# Patient Record
Sex: Male | Born: 1980 | Race: White | Hispanic: No | Marital: Single | Smoking: Former smoker
Health system: Southern US, Community
[De-identification: ages and names within clinical notes are randomized; demographics above are authoritative.]

## PROBLEM LIST (undated history)

## (undated) DIAGNOSIS — F32A Depression, unspecified: Secondary | ICD-10-CM

## (undated) DIAGNOSIS — K8020 Calculus of gallbladder without cholecystitis without obstruction: Secondary | ICD-10-CM

---

## 2021-04-29 ENCOUNTER — Inpatient Hospital Stay (HOSPITAL_COMMUNITY)
Admission: EM | Admit: 2021-04-29 | Discharge: 2021-05-01 | DRG: 917 | Disposition: A | Discharge status: Home / Self Care | Payer: BC Managed Care – PPO | Attending: Internal Medicine | Admitting: Internal Medicine

## 2021-04-29 DIAGNOSIS — F32A Depression, unspecified: Secondary | ICD-10-CM | POA: Diagnosis present

## 2021-04-29 DIAGNOSIS — R739 Hyperglycemia, unspecified: Secondary | ICD-10-CM | POA: Diagnosis present

## 2021-04-29 DIAGNOSIS — F1020 Alcohol dependence, uncomplicated: Secondary | ICD-10-CM | POA: Diagnosis present

## 2021-04-29 DIAGNOSIS — F1110 Opioid abuse, uncomplicated: Secondary | ICD-10-CM | POA: Diagnosis present

## 2021-04-29 DIAGNOSIS — T50901A Poisoning by unspecified drugs, medicaments and biological substances, accidental (unintentional), initial encounter: Secondary | ICD-10-CM | POA: Diagnosis not present

## 2021-04-29 DIAGNOSIS — J9601 Acute respiratory failure with hypoxia: Secondary | ICD-10-CM | POA: Diagnosis not present

## 2021-04-29 DIAGNOSIS — Z79899 Other long term (current) drug therapy: Secondary | ICD-10-CM

## 2021-04-29 DIAGNOSIS — Z20822 Contact with and (suspected) exposure to covid-19: Secondary | ICD-10-CM | POA: Diagnosis present

## 2021-04-29 DIAGNOSIS — J690 Pneumonitis due to inhalation of food and vomit: Secondary | ICD-10-CM | POA: Diagnosis present

## 2021-04-29 DIAGNOSIS — R042 Hemoptysis: Secondary | ICD-10-CM | POA: Diagnosis present

## 2021-04-29 DIAGNOSIS — Z813 Family history of other psychoactive substance abuse and dependence: Secondary | ICD-10-CM

## 2021-04-29 DIAGNOSIS — T17908A Unspecified foreign body in respiratory tract, part unspecified causing other injury, initial encounter: Secondary | ICD-10-CM

## 2021-04-29 DIAGNOSIS — R7401 Elevation of levels of liver transaminase levels: Secondary | ICD-10-CM | POA: Diagnosis present

## 2021-04-29 DIAGNOSIS — G4700 Insomnia, unspecified: Secondary | ICD-10-CM | POA: Diagnosis present

## 2021-04-29 HISTORY — DX: Calculus of gallbladder without cholecystitis without obstruction: K80.20

## 2021-04-29 HISTORY — DX: Depression, unspecified: F32.A

## 2021-04-30 ENCOUNTER — Emergency Department (HOSPITAL_COMMUNITY): Payer: BC Managed Care – PPO

## 2021-04-30 ENCOUNTER — Encounter (HOSPITAL_COMMUNITY): Payer: Self-pay

## 2021-04-30 DIAGNOSIS — R739 Hyperglycemia, unspecified: Secondary | ICD-10-CM | POA: Diagnosis present

## 2021-04-30 DIAGNOSIS — T50901A Poisoning by unspecified drugs, medicaments and biological substances, accidental (unintentional), initial encounter: Secondary | ICD-10-CM | POA: Diagnosis present

## 2021-04-30 DIAGNOSIS — Z813 Family history of other psychoactive substance abuse and dependence: Secondary | ICD-10-CM | POA: Diagnosis not present

## 2021-04-30 DIAGNOSIS — F32A Depression, unspecified: Secondary | ICD-10-CM | POA: Diagnosis present

## 2021-04-30 DIAGNOSIS — J9601 Acute respiratory failure with hypoxia: Secondary | ICD-10-CM | POA: Diagnosis present

## 2021-04-30 DIAGNOSIS — F1020 Alcohol dependence, uncomplicated: Secondary | ICD-10-CM | POA: Diagnosis present

## 2021-04-30 DIAGNOSIS — T17908A Unspecified foreign body in respiratory tract, part unspecified causing other injury, initial encounter: Secondary | ICD-10-CM | POA: Diagnosis not present

## 2021-04-30 DIAGNOSIS — F1110 Opioid abuse, uncomplicated: Secondary | ICD-10-CM | POA: Diagnosis present

## 2021-04-30 DIAGNOSIS — R042 Hemoptysis: Secondary | ICD-10-CM | POA: Diagnosis present

## 2021-04-30 DIAGNOSIS — R7401 Elevation of levels of liver transaminase levels: Secondary | ICD-10-CM | POA: Diagnosis present

## 2021-04-30 DIAGNOSIS — G4700 Insomnia, unspecified: Secondary | ICD-10-CM | POA: Diagnosis present

## 2021-04-30 DIAGNOSIS — J690 Pneumonitis due to inhalation of food and vomit: Secondary | ICD-10-CM | POA: Diagnosis present

## 2021-04-30 DIAGNOSIS — Z20822 Contact with and (suspected) exposure to covid-19: Secondary | ICD-10-CM | POA: Diagnosis present

## 2021-04-30 DIAGNOSIS — Z79899 Other long term (current) drug therapy: Secondary | ICD-10-CM | POA: Diagnosis not present

## 2021-04-30 LAB — ETHANOL: Alcohol, Ethyl (B): 38 mg/dL — ABNORMAL HIGH (ref <10)

## 2021-04-30 LAB — CBC WITH DIFFERENTIAL/PLATELET
Abs Immature Granulocytes: 0.1 10*3/uL — ABNORMAL HIGH (ref 0.00–0.07)
Basophils Absolute: 0.1 10*3/uL (ref 0.0–0.1)
Basophils Relative: 1 %
Eosinophils Absolute: 0.1 10*3/uL (ref 0.0–0.5)
Eosinophils Relative: 1 %
HCT: 47.2 % (ref 39.0–52.0)
Hemoglobin: 16.2 g/dL (ref 13.0–17.0)
Immature Granulocytes: 1 %
Lymphocytes Relative: 19 %
Lymphs Abs: 2 10*3/uL (ref 0.7–4.0)
MCH: 31.1 pg (ref 26.0–34.0)
MCHC: 34.3 g/dL (ref 30.0–36.0)
MCV: 90.6 fL (ref 80.0–100.0)
Monocytes Absolute: 0.6 10*3/uL (ref 0.1–1.0)
Monocytes Relative: 6 %
Neutro Abs: 7.7 10*3/uL (ref 1.7–7.7)
Neutrophils Relative %: 72 %
Platelets: 261 10*3/uL (ref 150–400)
RBC: 5.21 MIL/uL (ref 4.22–5.81)
RDW: 14.4 % (ref 11.5–15.5)
WBC: 10.5 10*3/uL (ref 4.0–10.5)
nRBC: 0 % (ref 0.0–0.2)

## 2021-04-30 LAB — COMPREHENSIVE METABOLIC PANEL
ALT: 31 U/L (ref 0–44)
AST: 46 U/L — ABNORMAL HIGH (ref 15–41)
Albumin: 4.1 g/dL (ref 3.5–5.0)
Alkaline Phosphatase: 53 U/L (ref 38–126)
Anion gap: 12 (ref 5–15)
BUN: 9 mg/dL (ref 6–20)
CO2: 23 mmol/L (ref 22–32)
Calcium: 8.7 mg/dL — ABNORMAL LOW (ref 8.9–10.3)
Chloride: 103 mmol/L (ref 98–111)
Creatinine, Ser: 0.94 mg/dL (ref 0.61–1.24)
GFR, Estimated: >60 mL/min (ref >60)
Glucose, Bld: 133 mg/dL — ABNORMAL HIGH (ref 70–99)
Potassium: 3.5 mmol/L (ref 3.5–5.1)
Sodium: 138 mmol/L (ref 135–145)
Total Bilirubin: 0.7 mg/dL (ref 0.3–1.2)
Total Protein: 7.2 g/dL (ref 6.5–8.1)

## 2021-04-30 LAB — URINALYSIS, ROUTINE W REFLEX MICROSCOPIC
Bilirubin Urine: NEGATIVE
Glucose, UA: NEGATIVE mg/dL
Hgb urine dipstick: NEGATIVE
Ketones, ur: NEGATIVE mg/dL
Leukocytes,Ua: NEGATIVE
Nitrite: NEGATIVE
Protein, ur: NEGATIVE mg/dL
Specific Gravity, Urine: 1.017 (ref 1.005–1.030)
pH: 6 (ref 5.0–8.0)

## 2021-04-30 LAB — SALICYLATE LEVEL: Salicylate Lvl: <7 mg/dL — ABNORMAL LOW (ref 7.0–30.0)

## 2021-04-30 LAB — MAGNESIUM: Magnesium: 1.9 mg/dL (ref 1.7–2.4)

## 2021-04-30 LAB — RESP PANEL BY RT-PCR (FLU A&B, COVID) ARPGX2
Influenza A by PCR: NEGATIVE
Influenza B by PCR: NEGATIVE
SARS Coronavirus 2 by RT PCR: NEGATIVE

## 2021-04-30 LAB — ACETAMINOPHEN LEVEL: Acetaminophen (Tylenol), Serum: <10 ug/mL — ABNORMAL LOW (ref 10–30)

## 2021-04-30 LAB — CBG MONITORING, ED: Glucose-Capillary: 134 mg/dL — ABNORMAL HIGH (ref 70–99)

## 2021-04-30 LAB — PHOSPHORUS: Phosphorus: 4.2 mg/dL (ref 2.5–4.6)

## 2021-04-30 MED ORDER — LORAZEPAM 2 MG/ML IJ SOLN: 0.0000 mg | Freq: Three times a day (TID) | INTRAMUSCULAR | Status: DC

## 2021-04-30 MED ORDER — SODIUM CHLORIDE 0.9 % IV SOLN
3.0000 g | Freq: Four times a day (QID) | INTRAVENOUS | Status: DC
  Administered 2021-04-30 – 2021-05-01 (×5): 3 g via INTRAVENOUS
  Filled 2021-04-30 (×6): qty 8

## 2021-04-30 MED ORDER — BUPROPION HCL ER (XL) 300 MG PO TB24
300.0000 mg | ORAL_TABLET | Freq: Every morning | ORAL | Status: DC
  Administered 2021-04-30 – 2021-05-01 (×2): 300 mg via ORAL
  Filled 2021-04-30 (×2): qty 2

## 2021-04-30 MED ORDER — LACTATED RINGERS IV BOLUS
1000.0000 mL | Freq: Once | INTRAVENOUS | Status: AC
  Administered 2021-04-30: 1000 mL via INTRAVENOUS

## 2021-04-30 MED ORDER — FOLIC ACID 1 MG PO TABS
1.0000 mg | ORAL_TABLET | Freq: Every day | ORAL | Status: DC
  Administered 2021-04-30 – 2021-05-01 (×2): 1 mg via ORAL
  Filled 2021-04-30 (×2): qty 1

## 2021-04-30 MED ORDER — PANTOPRAZOLE SODIUM 40 MG PO TBEC
40.0000 mg | DELAYED_RELEASE_TABLET | Freq: Every day | ORAL | Status: DC
  Administered 2021-04-30 – 2021-05-01 (×2): 40 mg via ORAL
  Filled 2021-04-30 (×2): qty 1

## 2021-04-30 MED ORDER — THIAMINE HCL 100 MG PO TABS
100.0000 mg | ORAL_TABLET | Freq: Every day | ORAL | Status: DC
  Administered 2021-04-30 – 2021-05-01 (×2): 100 mg via ORAL
  Filled 2021-04-30 (×2): qty 1

## 2021-04-30 MED ORDER — ACETAMINOPHEN 325 MG PO TABS: 650.0000 mg | ORAL_TABLET | ORAL | Status: DC | PRN

## 2021-04-30 MED ORDER — LORAZEPAM 1 MG PO TABS
1.0000 mg | ORAL_TABLET | ORAL | Status: DC | PRN
  Administered 2021-04-30: 1 mg via ORAL
  Filled 2021-04-30 (×2): qty 1

## 2021-04-30 MED ORDER — ADULT MULTIVITAMIN W/MINERALS CH
1.0000 | ORAL_TABLET | Freq: Every day | ORAL | Status: DC
  Administered 2021-04-30 – 2021-05-01 (×2): 1 via ORAL
  Filled 2021-04-30 (×2): qty 1

## 2021-04-30 MED ORDER — METHYLPREDNISOLONE SODIUM SUCC 40 MG IJ SOLR
40.0000 mg | Freq: Once | INTRAMUSCULAR | Status: AC
  Administered 2021-04-30: 40 mg via INTRAVENOUS
  Filled 2021-04-30: qty 1

## 2021-04-30 MED ORDER — LORAZEPAM 2 MG/ML IJ SOLN
0.0000 mg | INTRAMUSCULAR | Status: DC
  Administered 2021-04-30 – 2021-05-01 (×2): 1 mg via INTRAVENOUS
  Filled 2021-04-30 (×2): qty 1

## 2021-04-30 MED ORDER — SODIUM CHLORIDE 0.9 % IV BOLUS
1000.0000 mL | Freq: Once | INTRAVENOUS | Status: AC
  Administered 2021-04-30: 1000 mL via INTRAVENOUS

## 2021-04-30 MED ORDER — ONDANSETRON HCL 4 MG/2ML IJ SOLN
4.0000 mg | Freq: Four times a day (QID) | INTRAMUSCULAR | Status: DC | PRN
  Administered 2021-04-30: 4 mg via INTRAVENOUS
  Filled 2021-04-30: qty 2

## 2021-04-30 MED ORDER — POTASSIUM CHLORIDE CRYS ER 20 MEQ PO TBCR
40.0000 meq | EXTENDED_RELEASE_TABLET | Freq: Once | ORAL | Status: AC
  Administered 2021-04-30: 40 meq via ORAL
  Filled 2021-04-30: qty 2

## 2021-04-30 MED ORDER — MAGNESIUM SULFATE 2 GM/50ML IV SOLN
2.0000 g | Freq: Once | INTRAVENOUS | Status: AC
  Administered 2021-04-30: 2 g via INTRAVENOUS
  Filled 2021-04-30: qty 50

## 2021-04-30 MED ORDER — ACETAMINOPHEN 325 MG PO TABS: 650.0000 mg | ORAL_TABLET | Freq: Four times a day (QID) | ORAL | Status: DC | PRN

## 2021-04-30 MED ORDER — LORAZEPAM 2 MG/ML IJ SOLN: 1.0000 mg | INTRAMUSCULAR | Status: DC | PRN

## 2021-04-30 MED ORDER — SODIUM CHLORIDE 0.9 % IV SOLN
3.0000 g | Freq: Once | INTRAVENOUS | Status: AC
  Administered 2021-04-30: 3 g via INTRAVENOUS
  Filled 2021-04-30: qty 8

## 2021-04-30 MED ORDER — KETOROLAC TROMETHAMINE 30 MG/ML IJ SOLN
30.0000 mg | Freq: Once | INTRAMUSCULAR | Status: AC
  Administered 2021-04-30: 30 mg via INTRAVENOUS
  Filled 2021-04-30: qty 1

## 2021-04-30 MED ORDER — LACTATED RINGERS IV SOLN: INTRAVENOUS | Status: AC

## 2021-04-30 MED ORDER — ALBUTEROL SULFATE (2.5 MG/3ML) 0.083% IN NEBU: 2.5000 mg | INHALATION_SOLUTION | RESPIRATORY_TRACT | Status: DC | PRN

## 2021-04-30 MED ORDER — THIAMINE HCL 100 MG/ML IJ SOLN
100.0000 mg | Freq: Every day | INTRAMUSCULAR | Status: DC
  Filled 2021-04-30 (×2): qty 2

## 2021-04-30 MED ORDER — ESCITALOPRAM OXALATE 10 MG PO TABS
15.0000 mg | ORAL_TABLET | Freq: Every day | ORAL | Status: DC
  Administered 2021-04-30 – 2021-05-01 (×2): 15 mg via ORAL
  Filled 2021-04-30 (×2): qty 2

## 2021-04-30 NOTE — Consult Note (Signed)
[facility] Face-to-Face Psychiatry Consult   Reason for Consult: Depression.  Alcohol and opioid abuse.  Had an accidental overdose with street opioids.  Admitted for aspiration pneumonia Referring Physician: Dr. Robb Matar Patient Identification: Eric Nolan MRN:  [id number] Principal Diagnosis: Drug overdose, accidental or unintentional, initial encounter Diagnosis:  Principal Problem:   Drug overdose, accidental or unintentional, initial encounter Active Problems:   Hypocalcemia   Hyperglycemia   Elevated AST (SGOT)   Alcohol dependence (HCC)   Depression   Aspiration into airway   Total Time spent with patient: 30 minutes  Subjective:   Eric Nolan is a 40 y.o. male patient admitted with aspiration pneumonia, secondary to accidental overdose in which he was found apneic and cyanotic.  Patient adamantly denies this as an intentional overdose.  He denies any previous history of suicidal ideations, suicidal thoughts, and or self-harm behavior.  He denies any stressors, triggers, and on traumatic events that could have resulted in an intentional overdose he admits to daily use of alcohol and opiate.  He states he drinks about 5-10 alcoholic beverages a day, for the past year.  He states he also uses about 1/2 of OxyContin daily.  He does admit that his opiate are bought off the street, and it is unclear what he took this time as he has never felt this way before.  He reports that his memory is clouded, very groggy, and has difficulty recalling events taking place prior to this admission.  Patient is able to state and continuously refute any depression symptoms, anxiety, mania, intrusive thoughts, delusions, paranoia, and otherwise psychiatric symptoms for the past 2 weeks.  When providing substance abuse counseling, patient is open to substance abuse resources.  Patient is showing motivation to quit" getting closer", unfortunately he is not ready to do so at this time.  Patient continues to  deny suicidal ideations, homicidal ideations, and or auditory or visual hallucinations.  Patient does not appear to be a danger to himself and or others at this time, despite ongoing substance abuse.  Patient will be psychiatrically cleared and can follow up with outpatient psychiatric resources which will be provided in at after visit summary.  HPI:  Level 5 caveat acuity of condition.  Patient brought in by EMS after suspected narcotic overdose.  He snorted what he thought was "half of a 30 OxyContin".  He was apparently found to be apneic and cyanotic by his girlfriend.  EMS gave him 4 mg of intranasal Narcan.  On arrival he is awake and alert and very diaphoretic.  He complains of some shortness of breath.  EMS reports no vomiting or suspected aspiration or trauma.  Patient denies chest pain, abdominal pain, cough or fever. States he uses oxy on a regular basis and this was not a suicide attempt.  Denies any injection drug use. Denies any suicidal thoughts  Past Psychiatric History: Polysubstance use disorder.  No known outpatient psychiatric providers and or psychiatric services being received at this time.  No history of inpatient and or outpatient psychiatric admissions.  No history of suicide attempts.  No previous and/or current psychotropic medication has been prescribed.  Risk to Self: Denies Risk to Others: Denies  Prior Inpatient Therapy: Denies Prior Outpatient Therapy: Denies  Past Medical History:  Past Medical History:  Diagnosis Date   Depression    Gallstones    History reviewed. No pertinent surgical history. Family History:  Family History  Problem Relation Age of Onset   Valvular heart disease Father  Gout Father    Drug abuse Brother    Alzheimer's disease Maternal Grandmother    Heart attack Paternal Grandfather    Stroke Paternal Grandfather    Family Psychiatric  History: Denies Social History:  Social History   Substance and Sexual Activity  Alcohol Use  Yes   Alcohol/week: 70.0 standard drinks   Types: 70 Standard drinks or equivalent per week   Comment: 5-10 drinks per day     Social History   Substance and Sexual Activity  Drug Use Yes    Social History   Socioeconomic History   Marital status: Single    Spouse name: Not on file   Number of children: Not on file   Years of education: Not on file   Highest education level: Not on file  Occupational History   Not on file  Tobacco Use   Smoking status: Never   Smokeless tobacco: Never  Vaping Use   Vaping Use: Never used  Substance and Sexual Activity   Alcohol use: Yes    Alcohol/week: 70.0 standard drinks    Types: 70 Standard drinks or equivalent per week    Comment: 5-10 drinks per day   Drug use: Yes   Sexual activity: Yes  Other Topics Concern   Not on file  Social History Narrative   Not on file   Social Determinants of Health   Financial Resource Strain: Not on file  Food Insecurity: Not on file  Transportation Needs: Not on file  Physical Activity: Not on file  Stress: Not on file  Social Connections: Not on file   Additional Social History:    Allergies:  No Known Allergies  Labs:  Results for orders placed or performed during the hospital encounter of 04/29/21 (from the past 48 hour(s))  CBC with Differential/Platelet     Status: Abnormal   Collection Time: 04/30/21 12:21 AM  Result Value Ref Range   WBC 10.5 4.0 - 10.5 K/uL   RBC 5.21 4.22 - 5.81 MIL/uL   Hemoglobin 16.2 13.0 - 17.0 g/dL   HCT 96.0 45.4 - 09.8 %   MCV 90.6 80.0 - 100.0 fL   MCH 31.1 26.0 - 34.0 pg   MCHC 34.3 30.0 - 36.0 g/dL   RDW 11.9 14.7 - 82.9 %   Platelets 261 150 - 400 K/uL   nRBC 0.0 0.0 - 0.2 %   Neutrophils Relative % 72 %   Neutro Abs 7.7 1.7 - 7.7 K/uL   Lymphocytes Relative 19 %   Lymphs Abs 2.0 0.7 - 4.0 K/uL   Monocytes Relative 6 %   Monocytes Absolute 0.6 0.1 - 1.0 K/uL   Eosinophils Relative 1 %   Eosinophils Absolute 0.1 0.0 - 0.5 K/uL    Basophils Relative 1 %   Basophils Absolute 0.1 0.0 - 0.1 K/uL   Immature Granulocytes 1 %   Abs Immature Granulocytes 0.10 (H) 0.00 - 0.07 K/uL    Comment: Performed at [facility], [address], [place], [place] [address]  Comprehensive metabolic panel     Status: Abnormal   Collection Time: 04/30/21 12:21 AM  Result Value Ref Range   Sodium 138 135 - 145 mmol/L   Potassium 3.5 3.5 - 5.1 mmol/L   Chloride 103 98 - 111 mmol/L   CO2 23 22 - 32 mmol/L   Glucose, Bld 133 (H) 70 - 99 mg/dL    Comment: Glucose reference range applies only to samples taken after fasting for at  least 8 hours.   BUN 9 6 - 20 mg/dL   Creatinine, Ser 1.61 0.61 - 1.24 mg/dL   Calcium 8.7 (L) 8.9 - 10.3 mg/dL   Total Protein 7.2 6.5 - 8.1 g/dL   Albumin 4.1 3.5 - 5.0 g/dL   AST 46 (H) 15 - 41 U/L   ALT 31 0 - 44 U/L   Alkaline Phosphatase 53 38 - 126 U/L   Total Bilirubin 0.7 0.3 - 1.2 mg/dL   GFR, Estimated >09 >60 mL/min    Comment: (NOTE) Calculated using the CKD-EPI Creatinine Equation (2021)    Anion gap 12 5 - 15    Comment: Performed at [facility], [address], [place], [place] [address]  Ethanol     Status: Abnormal   Collection Time: 04/30/21 12:21 AM  Result Value Ref Range   Alcohol, Ethyl (B) 38 (H) <10 mg/dL    Comment: (NOTE) Lowest detectable limit for serum alcohol is 10 mg/dL.  For medical purposes only. Performed at [facility], [address] [place], [place] [address]   Acetaminophen level     Status: Abnormal   Collection Time: 04/30/21 12:21 AM  Result Value Ref Range   Acetaminophen (Tylenol), Serum <10 (L) 10 - 30 ug/mL    Comment: (NOTE) Therapeutic concentrations vary significantly. A range of 10-30 ug/mL  may be an effective concentration for many patients. However, some  are best treated at concentrations outside of this range. Acetaminophen concentrations >150 ug/mL at 4 hours after ingestion  and >50  ug/mL at 12 hours after ingestion are often associated with  toxic reactions.  Performed at [facility], [address] [place], [place] [address]   Salicylate level     Status: Abnormal   Collection Time: 04/30/21 12:21 AM  Result Value Ref Range   Salicylate Lvl <7.0 (L) 7.0 - 30.0 mg/dL    Comment: Performed at [facility], [address] [place], [place] [address]  Magnesium     Status: None   Collection Time: 04/30/21 12:21 AM  Result Value Ref Range   Magnesium 1.9 1.7 - 2.4 mg/dL    Comment: Performed at [facility], [address], [place], [place] [address]  Phosphorus     Status: None   Collection Time: 04/30/21 12:21 AM  Result Value Ref Range   Phosphorus 4.2 2.5 - 4.6 mg/dL    Comment: Performed at [facility], [address] [place], [place] [address]  CBG monitoring, ED     Status: Abnormal   Collection Time: 04/30/21 12:24 AM  Result Value Ref Range   Glucose-Capillary 134 (H) 70 - 99 mg/dL    Comment: Glucose reference range applies only to samples taken after fasting for at least 8 hours.  Resp Panel by RT-PCR (Flu A&B, Covid) Nasopharyngeal Swab     Status: None   Collection Time: 04/30/21  3:15 AM   Specimen: Nasopharyngeal Swab; Nasopharyngeal(NP) swabs in vial transport medium  Result Value Ref Range   SARS Coronavirus 2 by RT PCR NEGATIVE NEGATIVE    Comment: (NOTE) SARS-CoV-2 target nucleic acids are NOT DETECTED.  The SARS-CoV-2 RNA is generally detectable in upper respiratory specimens during the acute phase of infection. The lowest concentration of SARS-CoV-2 viral copies this assay can detect is 138 copies/mL. A negative result does not preclude SARS-Cov-2 infection and should not be used as the sole basis for treatment or other patient management  decisions. A negative result may occur with  improper specimen collection/handling, submission of specimen other than  nasopharyngeal swab, presence of viral mutation(s) within the areas targeted by this assay, and inadequate number of viral copies(<138 copies/mL). A negative result must be combined with clinical observations, patient history, and epidemiological information. The expected result is Negative.  Fact Sheet for Patients:  BloggerCourse.com  Fact Sheet for Healthcare Providers:  SeriousBroker.it  This test is no t yet approved or cleared by the [place] FDA and  has been authorized for detection and/or diagnosis of SARS-CoV-2 by FDA under an Emergency Use Authorization (EUA). This EUA will remain  in effect (meaning this test can be used) for the duration of the COVID-19 declaration under Section 564(b)(1) of the Act, 21 U.S.C.section 360bbb-3(b)(1), unless the authorization is terminated  or revoked sooner.       Influenza A by PCR NEGATIVE NEGATIVE   Influenza B by PCR NEGATIVE NEGATIVE    Comment: (NOTE) The Xpert Xpress SARS-CoV-2/FLU/RSV plus assay is intended as an aid in the diagnosis of influenza from Nasopharyngeal swab specimens and should not be used as a sole basis for treatment. Nasal washings and aspirates are unacceptable for Xpert Xpress SARS-CoV-2/FLU/RSV testing.  Fact Sheet for Patients: BloggerCourse.com  Fact Sheet for Healthcare Providers: SeriousBroker.it  This test is not yet approved or cleared by the [place] FDA and has been authorized for detection and/or diagnosis of SARS-CoV-2 by FDA under an Emergency Use Authorization (EUA). This EUA will remain in effect (meaning this test can be used) for the duration of the COVID-19 declaration under Section 564(b)(1) of the Act, 21 U.S.C. section 360bbb-3(b)(1), unless the authorization is terminated or revoked.  Performed at [facility], [address], [place], [place]  [address]   Urinalysis, Routine w reflex microscopic     Status: None   Collection Time: 04/30/21 10:18 AM  Result Value Ref Range   Color, Urine YELLOW YELLOW   APPearance CLEAR CLEAR   Specific Gravity, Urine 1.017 1.005 - 1.030   pH 6.0 5.0 - 8.0   Glucose, UA NEGATIVE NEGATIVE mg/dL   Hgb urine dipstick NEGATIVE NEGATIVE   Bilirubin Urine NEGATIVE NEGATIVE   Ketones, ur NEGATIVE NEGATIVE mg/dL   Protein, ur NEGATIVE NEGATIVE mg/dL   Nitrite NEGATIVE NEGATIVE   Leukocytes,Ua NEGATIVE NEGATIVE    Comment: Performed at [facility], [address] [place], [place] [address]    Current Facility-Administered Medications  Medication Dose Route Frequency Provider Last Rate Last Admin   acetaminophen (TYLENOL) tablet 650 mg  650 mg Oral Q6H PRN Bobette Mo, MD       albuterol (PROVENTIL) (2.5 MG/3ML) 0.083% nebulizer solution 2.5 mg  2.5 mg Nebulization Q4H PRN Shalhoub, Deno Lunger, MD       Ampicillin-Sulbactam (UNASYN) 3 g in sodium chloride 0.9 % 100 mL IVPB  3 g Intravenous Q6H Bobette Mo, MD 200 mL/hr at 04/30/21 1118 3 g at 04/30/21 1118   buPROPion (WELLBUTRIN XL) 24 hr tablet 300 mg  300 mg Oral q morning Bobette Mo, MD   300 mg at 04/30/21 3267   escitalopram (LEXAPRO) tablet 15 mg  15 mg Oral Daily Bobette Mo, MD   15 mg at 04/30/21 1245   folic acid (FOLVITE) tablet 1 mg  1 mg Oral Daily Bobette Mo, MD   1 mg at 04/30/21 8099   lactated ringers infusion   Intravenous Continuous Shalhoub, Deno Lunger,  MD 125 mL/hr at 04/30/21 0345 New Bag at 04/30/21 0345   LORazepam (ATIVAN) injection 0-4 mg  0-4 mg Intravenous Q4H Bobette Mo, MD   1 mg at 04/30/21 1003   Followed by   Melene Muller ON 05/02/2021] LORazepam (ATIVAN) injection 0-4 mg  0-4 mg Intravenous Q8H Bobette Mo, MD       LORazepam (ATIVAN) tablet 1-4 mg  1-4 mg Oral Q1H PRN Bobette Mo, MD       Or   LORazepam (ATIVAN) injection 1-4 mg  1-4 mg  Intravenous Q1H PRN Bobette Mo, MD       multivitamin with minerals tablet 1 tablet  1 tablet Oral Daily Bobette Mo, MD   1 tablet at 04/30/21 0938   ondansetron (ZOFRAN) injection 4 mg  4 mg Intravenous Q6H PRN Marinda Elk, MD   4 mg at 04/30/21 1108   pantoprazole (PROTONIX) EC tablet 40 mg  40 mg Oral Daily Bobette Mo, MD   40 mg at 04/30/21 1117   thiamine tablet 100 mg  100 mg Oral Daily Bobette Mo, MD   100 mg at 04/30/21 4098   Or   thiamine (B-1) injection 100 mg  100 mg Intravenous Daily Bobette Mo, MD       Current Outpatient Medications  Medication Sig Dispense Refill   buPROPion (WELLBUTRIN XL) 300 MG 24 hr tablet Take 300 mg by mouth every morning.     escitalopram (LEXAPRO) 10 MG tablet Take 15 mg by mouth daily.      Musculoskeletal: Strength & Muscle Tone: within normal limits Gait & Station: unable to stand Patient leans: N/A            Psychiatric Specialty Exam:  Presentation  General Appearance: Appropriate for Environment; Casual  Eye Contact:Fair  Speech:Clear and Coherent; Slow  Speech Volume:Decreased  Handedness:Right   Mood and Affect  Mood:-- (good)  Affect:Congruent; Appropriate   Thought Process  Thought Processes:Coherent; Linear  Descriptions of Associations:Intact  Orientation:Full (Time, Place and Person)  Thought Content:Logical  History of Schizophrenia/Schizoaffective disorder:No data recorded Duration of Psychotic Symptoms:No data recorded Hallucinations:Hallucinations: None  Ideas of Reference:None  Suicidal Thoughts:Suicidal Thoughts: No  Homicidal Thoughts:Homicidal Thoughts: No   Sensorium  Memory:Immediate Good; Remote Good; Recent Good  Judgment:Fair  Insight:Fair   Executive Functions  Concentration:Fair  Attention Span:Good  Recall:Good  Fund of Knowledge:Good  Language:Good   Psychomotor Activity  Psychomotor Activity:Psychomotor  Activity: Normal   Assets  Assets:Communication Skills; Desire for Improvement; Financial Resources/Insurance; Housing; Social Support; Physical Health; Leisure Time   Sleep  Sleep:Sleep: Fair   Physical Exam: Physical Exam Vitals and nursing note reviewed.  Constitutional:      General: He is sleeping.     Appearance: He is obese. He is ill-appearing and diaphoretic.  Neurological:     Mental Status: He is easily aroused.  Psychiatric:        Mood and Affect: Mood normal.        Behavior: Behavior normal.        Thought Content: Thought content normal.        Judgment: Judgment normal.   ROS Blood pressure (!) 133/92, pulse (!) 104, temperature 97.9 F (36.6 C), temperature source Oral, resp. rate 18, height 5\' 10"  (1.778 m), weight 81.6 kg, SpO2 95 %. Body mass index is 25.83 kg/m.  Treatment Plan Summary: Plan Psych cleared at this time. Will place resources in AVS. Continue to assess for  patients motivation to seek substance abuse resources.  -Continue CIWA protocol, no history of DTs at this time.   Psychiatry to sign off. Patient is not a danger to himself and or others at the this time, does not need IVC.   Disposition: No evidence of imminent risk to self or others at present.   Patient does not meet criteria for psychiatric inpatient admission. Supportive therapy provided about ongoing stressors. Refer to IOP. Discussed crisis plan, support from social network, calling 911, coming to the Emergency Department, and calling Suicide Hotline.  Maryagnes Amos, FNP 04/30/2021 11:43 AM

## 2021-04-30 NOTE — ED Notes (Signed)
Attempted to get urine sample from pt. Pt has been unable to pee in the last two hours.

## 2021-04-30 NOTE — ED Notes (Signed)
See triage note by this RN for arrival info.

## 2021-04-30 NOTE — H&P (Signed)
History and Physical    Eric Nolan [id number] DOB: 1980/08/03 DOA: 04/29/2021  PCP: Vonna Drafts, FNP  Patient coming from: Home.  I have personally briefly reviewed patient's old medical records in [place]  Chief Complaint: Overdose.  HPI: Eric Nolan is a 40 y.o. male with medical history significant of depression, gallstones, alcohol abuse who is coming to the emergency department due to being found cyanotic by his girlfriend who called EMS.  He vomited and aspirated to the airway.  The patient has been drinking 5-10 drinks every night, which can be like 4 or a beer, but he has also been using Percocet or oxycodone that he buys from the street.  He took what he describes only as a white pill.  However, he thinks that these would a lot stronger than what he normally uses because he does not remember much of what happened.  He denied suicidal ideations.  He does not have any homicidal ideations.  He feels depressed but not as bad as it could be.  He has been having insomnia.  He denied fever, chills, but complains of frontal headache and feels hung over.  No rhinorrhea, sore throat, wheezing, but has hemoptysis while in the emergency department.  He stated that this has made he is breathing better.  No chest pain, palpitations, diaphoresis, PND, orthopnea or pitting edema of the lower extremities.  He sometimes feels pressure, not pain in his RUQ.  Denied nausea, diarrhea, constipation, melena or hematochezia.  No dysuria, flank pain, frequency or hematuria.  No polyuria, polydipsia, polyphagia or blurred vision.  ED Course: Initial vital signs were temperature 97.9 F, pulse 121, respiration 20, BP 104/94 mmHg and O2 sat 96% on NRB mask.  He is currently satting in the mid to high 90s on nasal cannula 5 LPM.  He received 1000 mL of NS bolus and was started on Unasyn in the emergency department.  I ordered a 1000 mL of LR bolus, ketorolac 30 mg IVP, magnesium sulfate  2 g IVPB and K-Lor 40 mEq p.o. x1 dose.  Lab work: His CBC was normal with a white count of 10.5, hemoglobin 16.2 g deciliter platelets 261.  Acetaminophen and salicylate levels were unremarkable.  CMP showed a glucose of 133 and calcium of 8.7 mg/dL.  AST was 46 units/L.  The rest of the CMP values were within expected range.  Phosphorus was 4.2 and magnesium 1.9 mg/dL.  Coronavirus/influenza PCR was negative.  Imaging: A portable 1 view chest radiograph showed diffuse left interstitial and peribronchial densities that may be chronic or represent atypical infection.  Please see image and full radiology report for further details.  Review of Systems: As per HPI otherwise all other systems reviewed and are negative.  Past Medical History:  Diagnosis Date   Depression    Gallstones    History reviewed. No pertinent surgical history.  Social History  reports that he has never smoked. He has never used smokeless tobacco. He reports current alcohol use of about 70.0 standard drinks per week. He reports current drug use.  No Known Allergies  Family History  Problem Relation Age of Onset   Valvular heart disease Father    Gout Father    Drug abuse Brother    Alzheimer's disease Maternal Grandmother    Heart attack Paternal Grandfather    Stroke Paternal Grandfather    Prior to Admission medications   Medication Sig Start Date End Date Taking? Authorizing Provider  buPROPion (  WELLBUTRIN XL) 300 MG 24 hr tablet Take 300 mg by mouth every morning. 04/14/21  Yes [provider]  escitalopram (LEXAPRO) 10 MG tablet Take 15 mg by mouth daily. 04/14/21  Yes [provider]   Physical Exam: Vitals:   04/30/21 0315 04/30/21 0321 04/30/21 0400 04/30/21 0845  BP: (!) 136/93  122/81 121/80  Pulse: (!) 109 (!) 103 (!) 101 (!) 106  Resp: (!) 21 (!) 24 20 18   Temp:      TempSrc:      SpO2: (!) 87% 92% 91% 95%  Weight:      Height:       Constitutional: NAD, calm,  comfortable. Eyes: PERRL, lids and conjunctivae normal ENMT: Mucous membranes are moist. Posterior pharynx clear of any exudate or lesions. Neck: normal, supple, no masses, no thyromegaly Respiratory: Mild rhonchi bilaterally, no wheezing, no crackles. Normal respiratory effort. No accessory muscle use.  Cardiovascular: Tachycardic in the 100s and 110s, no murmurs / rubs / gallops. No extremity edema. 2+ pedal pulses. No carotid bruits.  Abdomen: No distention.  Soft, no tenderness, no masses palpated. No hepatosplenomegaly. Bowel sounds positive.  Musculoskeletal: no clubbing / cyanosis. Good ROM, no contractures. Normal muscle tone.  Skin: no rashes, lesions, ulcers metabolic recommendation. Neurologic: CN 2-12 grossly intact. Sensation intact, DTR normal. Strength 5/5 in all 4.  Psychiatric: Normal judgment and insight. Alert and oriented x 3. Normal mood.   Labs on Admission: I have personally reviewed following labs and imaging studies  CBC: Recent Labs  Lab 04/30/21 0021  WBC 10.5  NEUTROABS 7.7  HGB 16.2  HCT 47.2  MCV 90.6  PLT 261   Basic Metabolic Panel: Recent Labs  Lab 04/30/21 0021  NA 138  K 3.5  CL 103  CO2 23  GLUCOSE 133*  BUN 9  CREATININE 0.94  CALCIUM 8.7*  MG 1.9  PHOS 4.2   GFR: Estimated Creatinine Clearance: 110 mL/min (by C-G formula based on SCr of 0.94 mg/dL).  Liver Function Tests: Recent Labs  Lab 04/30/21 0021  AST 46*  ALT 31  ALKPHOS 53  BILITOT 0.7  PROT 7.2  ALBUMIN 4.1   Urine analysis: No results found for: COLORURINE, APPEARANCEUR, LABSPEC, PHURINE, GLUCOSEU, HGBUR, BILIRUBINUR, KETONESUR, PROTEINUR, UROBILINOGEN, NITRITE, LEUKOCYTESUR  Radiological Exams on Admission: DG Chest Portable 1 View  Result Date: 04/30/2021 CLINICAL DATA:  Shortness of breath. EXAM: PORTABLE CHEST 1 VIEW COMPARISON:  None. FINDINGS: Diffuse left interstitial and peribronchial densities which may be chronic or represent atypical infection.  Clinical correlation recommended. No focal consolidation, pleural effusion or pneumothorax. The cardiac silhouette is within limits. No acute osseous pathology. IMPRESSION: Diffuse left interstitial and peribronchial densities may be chronic or represent atypical infection. Electronically Signed   By: 13/01/2021 M.D.   On: 04/30/2021 00:33    EKG: Independently reviewed.   Assessment/Plan Principal Problem:   Drug overdose,  accidental or unintentional Awake and alert nat the moment. Continue admit to PCU labs inpatient. Narcan as needed. Consult behavioral health. Consult TOC team.  Active Problems:   Aspiration into airway Continue supplemental oxygen. Bronchodilators as needed. Continue Unasyn 3 g IVPB every 6 hours. Solu-Medrol 40 mg IVP x1.    Alcohol dependence (HCC) Begin lorazepam CIWA protocol. Folate, MVI and thiamine.    Elevated AST (SGOT) Alcohol cessation advised.    Depression Continue bupropion 300 mg p.o. daily. Continue escitalopram 15 mg p.o. daily. Consult behavioral health.    Hypocalcemia Recheck calcium level. Further work-up  depending on level.    Hyperglycemia Nonfasting level. Repeat back pain normally.    DVT prophylaxis: SCDs. Code Status:   Full code. Family Communication:   Disposition Plan:   Patient is from:  Home.  Anticipated DC to:  Home.  Anticipated DC date:  05/02/2021 or 05/03/2021.  Anticipated DC barriers: Clinical status. Consults called:  Behavioral health and TOC team. Admission status:  Inpatient/PCU.  Severity of Illness:  Reubin Milan MD [facility]  How to contact the [facility] Attending or Consulting provider [place] or covering provider during after hours [place], for this patient?   Check the care team in [facility] and look for a) attending/consulting [facility] provider listed and b) the [facility] team listed Log into [email or web address] and use [place]'s universal password to access. If you do not have the  password, please contact the hospital operator. Locate the [facility] provider you are looking for under [facility] and page to a number that you can be directly reached. If you still have difficulty reaching the provider, please page the [facility] (Director on Call) for the Hospitalists listed on amion for assistance.  04/30/2021, 9:40 AM   This document was prepared in Dragon voice recognition software and may contain some unintended transcription errors.

## 2021-04-30 NOTE — ED Triage Notes (Signed)
Pt BIBA for OD on some kind of narcotic. Per EMS, girlfriend of pt found him on floor cyanotic. EMS gave 4mg  Narcan IN with +change. Pt diaphoretic, a/ox4 with NRM on at arrival. Rhonchi heard in lung fields. Pt denies SI attempt

## 2021-04-30 NOTE — ED Provider Notes (Signed)
[facility] DEPT Provider Note   CSN: [id number] Arrival date & time: 04/29/21  2357     History Chief Complaint  Patient presents with   Drug Overdose    Torez Bratland is a 40 y.o. male.  Level 5 caveat acuity of condition.  Patient brought in by EMS after suspected narcotic overdose.  He snorted what he thought was "half of a 30 OxyContin".  He was apparently found to be apneic and cyanotic by his girlfriend.  EMS gave him 4 mg of intranasal Narcan.  On arrival he is awake and alert and very diaphoretic.  He complains of some shortness of breath.  EMS reports no vomiting or suspected aspiration or trauma.  Patient denies chest pain, abdominal pain, cough or fever. States he uses oxy on a regular basis and this was not a suicide attempt.  Denies any injection drug use. Denies any suicidal thoughts  The history is provided by the patient and the EMS personnel. The history is limited by the condition of the patient.  Drug Overdose Associated symptoms include shortness of breath. Pertinent negatives include no chest pain, no abdominal pain and no headaches.      Past Medical History:  Diagnosis Date   Depression     There are no problems to display for this patient.   History reviewed. No pertinent surgical history.     History reviewed. No pertinent family history.  Social History   Tobacco Use   Smoking status: Never   Smokeless tobacco: Never  Vaping Use   Vaping Use: Never used  Substance Use Topics   Alcohol use: Yes    Alcohol/week: 70.0 standard drinks    Types: 70 Standard drinks or equivalent per week    Comment: 5-10 drinks per day   Drug use: Yes    Home Medications Prior to Admission medications   Not on File    Allergies    Patient has no known allergies.  Review of Systems   Review of Systems  Constitutional:  Positive for diaphoresis. Negative for activity change, appetite change, fatigue and fever.   HENT:  Negative for congestion and rhinorrhea.   Respiratory:  Positive for cough and shortness of breath.   Cardiovascular:  Negative for chest pain.  Gastrointestinal:  Negative for abdominal pain, nausea and vomiting.  Genitourinary:  Negative for dysuria and hematuria.  Musculoskeletal:  Negative for arthralgias and myalgias.  Skin:  Negative for rash.  Neurological:  Negative for dizziness, weakness and headaches.  Psychiatric/Behavioral:  Negative for self-injury and suicidal ideas. The patient is not nervous/anxious.    all other systems are negative except as noted in the HPI and PMH.   Physical Exam Updated Vital Signs BP (!) 104/94 (BP Location: Right Arm)   Pulse (!) 121   Temp 97.9 F (36.6 C) (Oral)   Resp 20   Ht 5\' 10"  (1.778 m)   Wt 81.6 kg   SpO2 96%   BMI 25.83 kg/m   Physical Exam Vitals and nursing note reviewed.  Constitutional:      General: He is not in acute distress.    Appearance: He is well-developed. He is ill-appearing and diaphoretic.     Comments: Ill-appearing, diaphoretic, tachycardic, tachypneic.  HENT:     Head: Normocephalic and atraumatic.     Mouth/Throat:     Pharynx: No oropharyngeal exudate.  Eyes:     Conjunctiva/sclera: Conjunctivae normal.     Pupils: Pupils are equal, round, and  reactive to light.  Neck:     Comments: No meningismus. Cardiovascular:     Rate and Rhythm: Regular rhythm. Tachycardia present.     Heart sounds: Normal heart sounds. No murmur heard. Pulmonary:     Effort: Respiratory distress present.     Breath sounds: Rhonchi present.  Chest:     Chest wall: No tenderness.  Abdominal:     Palpations: Abdomen is soft.     Tenderness: There is no abdominal tenderness. There is no guarding or rebound.  Musculoskeletal:        General: No tenderness. Normal range of motion.     Cervical back: Normal range of motion and neck supple.  Skin:    General: Skin is warm.     Capillary Refill: Capillary refill  takes less than 2 seconds.  Neurological:     General: No focal deficit present.     Mental Status: He is alert and oriented to person, place, and time. Mental status is at baseline.     Cranial Nerves: No cranial nerve deficit.     Motor: No abnormal muscle tone.     Coordination: Coordination normal.     Comments:  5/5 strength throughout. CN 2-12 intact.Equal grip strength.   Psychiatric:        Behavior: Behavior normal.    ED Results / Procedures / Treatments   Labs (all labs ordered are listed, but only abnormal results are displayed) Labs Reviewed  CBC WITH DIFFERENTIAL/PLATELET - Abnormal; Notable for the following components:      Result Value   Abs Immature Granulocytes 0.10 (*)    All other components within normal limits  COMPREHENSIVE METABOLIC PANEL - Abnormal; Notable for the following components:   Glucose, Bld 133 (*)    Calcium 8.7 (*)    AST 46 (*)    All other components within normal limits  ETHANOL - Abnormal; Notable for the following components:   Alcohol, Ethyl (B) 38 (*)    All other components within normal limits  ACETAMINOPHEN LEVEL - Abnormal; Notable for the following components:   Acetaminophen (Tylenol), Serum <10 (*)    All other components within normal limits  SALICYLATE LEVEL - Abnormal; Notable for the following components:   Salicylate Lvl <7.0 (*)    All other components within normal limits  CBG MONITORING, ED - Abnormal; Notable for the following components:   Glucose-Capillary 134 (*)    All other components within normal limits  RESP PANEL BY RT-PCR (FLU A&B, COVID) [id number]  URINALYSIS, ROUTINE W REFLEX MICROSCOPIC    EKG None  Radiology DG Chest Portable 1 View  Result Date: 04/30/2021 CLINICAL DATA:  Shortness of breath. EXAM: PORTABLE CHEST 1 VIEW COMPARISON:  None. FINDINGS: Diffuse left interstitial and peribronchial densities which may be chronic or represent atypical infection. Clinical correlation recommended. No focal  consolidation, pleural effusion or pneumothorax. The cardiac silhouette is within limits. No acute osseous pathology. IMPRESSION: Diffuse left interstitial and peribronchial densities may be chronic or represent atypical infection. Electronically Signed   By: Elgie Collard M.D.   On: 04/30/2021 00:33    Procedures .Critical Care Performed by: Glynn Octave, MD Authorized by: Glynn Octave, MD   Critical care provider statement:    Critical care time (minutes):  45   Critical care time was exclusive of:  Separately billable procedures and treating other patients   Critical care was necessary to treat or prevent imminent or life-threatening deterioration of the following conditions:  Respiratory  failure and toxidrome   Critical care was time spent personally by me on the following activities:  Blood draw for specimens, development of treatment plan with patient or surrogate, evaluation of patient's response to treatment, examination of patient, ordering and performing treatments and interventions, ordering and review of laboratory studies, ordering and review of radiographic studies, pulse oximetry, re-evaluation of patient's condition, review of old charts and obtaining history from patient or surrogate   I assumed direction of critical care for this patient from another provider in my specialty: no     Care discussed with: admitting provider     Medications Ordered in ED Medications  sodium chloride 0.9 % bolus 1,000 mL (has no administration in time range)    ED Course  I have reviewed the triage vital signs and the nursing notes.  Pertinent labs & imaging results that were available during my care of the patient were reviewed by me and considered in my medical decision making (see chart for details).    MDM Rules/Calculators/A&P                          Suspected narcotic overdose status post Narcan.  He was apneic and cyanotic.  Now awake but tachycardic and diaphoretic and  hypoxic.  Diffuse rhonchi throughout.  Sinus tachycardia.  Patient tachycardic.  Does have new oxygen requirement.  X-ray is concerning for left airspace disease.  Suspect aspiration.  Labs otherwise reassuring.  He has not required any further Narcan since arrival in the ED.  He is awake But desaturates to the low 80s with exertion.  Lung sounds have improved.  No wheezing or rhonchi. Will start empiric Unasyn for suspected aspiration.  Remains tachypneic and tachycardic and hypoxic.  Will need medical admission.  Tachycardia is improving.  Still tachypneic and hypoxic requiring 4 to 5 L of oxygen. Admission discussed with Dr. Cyd Silence.   ED ECG REPORT   Date: 04/30/2021  Rate: 127  Rhythm: sinus tachycardia  QRS Axis: normal  Intervals: normal  ST/T Wave abnormalities: normal  Conduction Disutrbances:none  Narrative Interpretation:   Old EKG Reviewed: none available  I have personally reviewed the EKG tracing and agree with the computerized printout as noted.  Final Clinical Impression(s) / ED Diagnoses Final diagnoses:  Acute respiratory failure with hypoxia ([place])  Accidental overdose, initial encounter  Aspiration pneumonia of left lower lobe, unspecified aspiration pneumonia type [facility])    Rx / DC Orders ED Discharge Orders     None        Ezequiel Essex, MD 04/30/21 [phone]

## 2021-04-30 NOTE — Progress Notes (Signed)
A consult was received from an ED physician for Unasyn per pharmacy dosing.  The patient's profile has been reviewed for ht/wt/allergies/indication/available labs.    A one time order has been placed for Unasyn 3gm IV.  Further antibiotics/pharmacy consults should be ordered by admitting physician if indicated.                       Thank you, Maryellen Pile, PharmD 04/30/2021  6:17 AM

## 2021-04-30 NOTE — ED Notes (Signed)
NT attempted to ambulate PT on RA. Pt spo2 down to 81% RA after ambulating to bathroom. Pt back to bed, [place] 6L placed back on pt. MD aware.

## 2021-05-01 ENCOUNTER — Other Ambulatory Visit: Payer: Self-pay

## 2021-05-01 DIAGNOSIS — T17908A Unspecified foreign body in respiratory tract, part unspecified causing other injury, initial encounter: Secondary | ICD-10-CM

## 2021-05-01 DIAGNOSIS — J9601 Acute respiratory failure with hypoxia: Secondary | ICD-10-CM

## 2021-05-01 LAB — HIV ANTIBODY (ROUTINE TESTING W REFLEX): HIV Screen 4th Generation wRfx: NONREACTIVE

## 2021-05-01 MED ORDER — CHLORDIAZEPOXIDE HCL 5 MG PO CAPS: 25.0000 mg | ORAL_CAPSULE | Freq: Four times a day (QID) | ORAL | Status: DC | PRN

## 2021-05-01 MED ORDER — GUAIFENESIN ER 600 MG PO TB12: 600.0000 mg | ORAL_TABLET | Freq: Two times a day (BID) | ORAL | 2 refills | Status: DC

## 2021-05-01 MED ORDER — ALBUTEROL SULFATE HFA 108 (90 BASE) MCG/ACT IN AERS: 2.0000 | INHALATION_SPRAY | Freq: Four times a day (QID) | RESPIRATORY_TRACT | 2 refills | Status: DC | PRN

## 2021-05-01 MED ORDER — CHLORDIAZEPOXIDE HCL 25 MG PO CAPS: 25.0000 mg | ORAL_CAPSULE | Freq: Four times a day (QID) | ORAL | 0 refills | Status: DC | PRN

## 2021-05-01 MED ORDER — AMOXICILLIN-POT CLAVULANATE 875-125 MG PO TABS: 1.0000 | ORAL_TABLET | Freq: Two times a day (BID) | ORAL | 0 refills | Status: AC

## 2021-05-01 MED ORDER — HYDROXYZINE HCL 25 MG PO TABS: 25.0000 mg | ORAL_TABLET | Freq: Four times a day (QID) | ORAL | Status: DC | PRN

## 2021-05-01 MED ORDER — FOLIC ACID 1 MG PO TABS
1.0000 mg | ORAL_TABLET | Freq: Every day | ORAL | 1 refills | Status: DC
Start: 2021-05-02 — End: 2023-01-14

## 2021-05-01 MED ORDER — THIAMINE HCL 100 MG PO TABS: 100.0000 mg | ORAL_TABLET | Freq: Every day | ORAL | 1 refills | Status: DC

## 2021-05-01 NOTE — Progress Notes (Signed)
Patient was able to ambulate independently on RA in hallway. Gait steady, no tremors noted. O2 sats measured at 95% with ambulation. Pt stated he felt slightly short of breath with ambulation. MD updated on patients activity. Patients mother at bedside.

## 2021-05-01 NOTE — Plan of Care (Signed)
  Problem: Education: Goal: Knowledge of General Education information will improve Description Including pain rating scale, medication(s)/side effects and non-pharmacologic comfort measures Outcome: Progressing   Problem: Clinical Measurements: Goal: Ability to maintain clinical measurements within normal limits will improve Outcome: Progressing   Problem: Activity: Goal: Risk for activity intolerance will decrease Outcome: Progressing   

## 2021-05-02 NOTE — Discharge Summary (Addendum)
Physician Discharge Summary  Eric Nolan SAY:[id number] DOB: 1981-04-08 DOA: 04/29/2021  PCP: Diamantina Providence, FNP  Admit date: 04/29/2021 Discharge date: 05/01/2021  Admitted From: Home Disposition: Home  Recommendations for Outpatient Follow-up:  Follow up with PCP in 1-2 weeks Please obtain BMP/CBC in one week Outpatient referral to behavioral health  Home Health: Equipment/Devices:  Discharge Condition: Stable CODE STATUS: Full code Diet recommendation: Regular diet  Brief/Interim Summary: 40 year old male with a history of depression, alcohol abuse, was found to be cyanotic by his girlfriend who called EMS.  He had vomited and aspirated into his airway.  He was brought to the hospital where he was noted to be tachycardic.  He was initially placed on nonrebreather mask.  He received IV fluids and was started on Unasyn.  He reported that he purchases opiates on the street, and his current medications may have been stronger than what he normally gets.  He denies any suicidal or homicidal ideations.  He was admitted for further management  Discharge Diagnoses:  Principal Problem:   Drug overdose, accidental or unintentional, initial encounter Active Problems:   Hypocalcemia   Hyperglycemia   Elevated AST (SGOT)   Alcohol dependence (HCC)   Depression   Aspiration into airway  Drug overdose, unintentional -Patient was monitored in the hospital -Overall mental status has improved, back to baseline -No longer seems to be impaired -Seen by behavioral health team, not felt to be a candidate for inpatient psychiatry.  Recommended outpatient psychiatry follow-up -He was also seen by [facility] team for resources on substance abuse rehab  Aspiration into airway -Started on Unasyn -Transition to Augmentin to complete antibiotic course -Currently on room air  Alcohol dependence -Monitored on CIWA protocol -No signs of active withdrawal at this time -Give a short course of  Librium to be used at home  Depression -Continued on bupropion and Lexapro   Discharge Instructions  Discharge Instructions     Diet - low sodium heart healthy   Complete by: As directed    Increase activity slowly   Complete by: As directed       Allergies as of 05/01/2021   No Known Allergies      Medication List     TAKE these medications    albuterol 108 (90 Base) MCG/ACT inhaler Commonly known as: VENTOLIN HFA Inhale 2 puffs into the lungs every 6 (six) hours as needed for wheezing or shortness of breath.   amoxicillin-clavulanate 875-125 MG tablet Commonly known as: Augmentin Take 1 tablet by mouth 2 (two) times daily for 5 days.   buPROPion 300 MG 24 hr tablet Commonly known as: WELLBUTRIN XL Take 300 mg by mouth every morning.   chlordiazePOXIDE 25 MG capsule Commonly known as: LIBRIUM Take 1 capsule (25 mg total) by mouth every 6 (six) hours as needed for anxiety (tremors).   escitalopram 10 MG tablet Commonly known as: LEXAPRO Take 15 mg by mouth daily.   folic acid 1 MG tablet Commonly known as: FOLVITE Take 1 tablet (1 mg total) by mouth daily.   guaiFENesin 600 MG 12 hr tablet Commonly known as: Mucinex Take 1 tablet (600 mg total) by mouth 2 (two) times daily.   thiamine 100 MG tablet Take 1 tablet (100 mg total) by mouth daily.        Follow-up Information     [facility] Follow up.   Specialty: Behavioral Health Why: Chemical dependency instensive outpatient programming. Contact information: [address] [place] [address]  [phone]         [facility] .   Specialty: [facility] information: [address] [place] [address] [place] [address] [phone]        Diamantina Providence, FNP. Schedule an appointment as soon as possible for a visit in 2 week(s).   Specialty: Nurse Practitioner Contact information: [address] [place] [address] [phone]                No Known Allergies  Consultations: Behavioral health   Procedures/Studies: DG Chest Portable 1 View  Result Date: 04/30/2021 CLINICAL DATA:  Shortness of breath. EXAM: PORTABLE CHEST 1 VIEW COMPARISON:  None. FINDINGS: Diffuse left interstitial and peribronchial densities which may be chronic or represent atypical infection. Clinical correlation recommended. No focal consolidation, pleural effusion or pneumothorax. The cardiac silhouette is within limits. No acute osseous pathology. IMPRESSION: Diffuse left interstitial and peribronchial densities may be chronic or represent atypical infection. Electronically Signed   By: Elgie Collard M.D.   On: 04/30/2021 00:33      Subjective: Feeling better.  Shortness of breath improved.  Still has some mild productive cough  Discharge Exam: Vitals:   05/01/21 0917 05/01/21 0930 05/01/21 1035 05/01/21 1245  BP:   103/60 109/65  Pulse:  89 89 95  Resp:  18 20   Temp: 98.7 F (37.1 C)  98.1 F (36.7 C)   TempSrc: Oral  Oral   SpO2:  96% 93%   Weight:      Height:        General: Pt is alert, awake, not in acute distress Cardiovascular: RRR, S1/S2 +, no rubs, no gallops Respiratory: CTA bilaterally, no wheezing, no rhonchi Abdominal: Soft, NT, ND, bowel sounds + Extremities: no edema, no cyanosis    The results of significant diagnostics from this hospitalization (including imaging, microbiology, ancillary and laboratory) are listed below for reference.     Microbiology: Recent Results (from the past 240 hour(s))  Resp Panel by RT-PCR (Flu A&B, Covid) Nasopharyngeal Swab     Status: None   Collection Time: 04/30/21  3:15 AM   Specimen: Nasopharyngeal Swab; Nasopharyngeal(NP) swabs in vial transport medium  Result Value Ref Range Status   SARS Coronavirus 2 by RT PCR NEGATIVE NEGATIVE Final    Comment: (NOTE) SARS-CoV-2 target nucleic acids are NOT DETECTED.  The  SARS-CoV-2 RNA is generally detectable in upper respiratory specimens during the acute phase of infection. The lowest concentration of SARS-CoV-2 viral copies this assay can detect is 138 copies/mL. A negative result does not preclude SARS-Cov-2 infection and should not be used as the sole basis for treatment or other patient management decisions. A negative result may occur with  improper specimen collection/handling, submission of specimen other than nasopharyngeal swab, presence of viral mutation(s) within the areas targeted by this assay, and inadequate number of viral copies(<138 copies/mL). A negative result must be combined with clinical observations, patient history, and epidemiological information. The expected result is Negative.  Fact Sheet for Patients:  BloggerCourse.com  Fact Sheet for Healthcare Providers:  SeriousBroker.it  This test is no t yet approved or cleared by the [place] FDA and  has been authorized for detection and/or diagnosis of SARS-CoV-2 by FDA under an Emergency Use Authorization (EUA). This EUA will remain  in effect (meaning this test can be used) for the duration of the COVID-19 declaration under Section 564(b)(1) of the Act, 21 U.S.C.section 360bbb-3(b)(1), unless  the authorization is terminated  or revoked sooner.       Influenza A by PCR NEGATIVE NEGATIVE Final   Influenza B by PCR NEGATIVE NEGATIVE Final    Comment: (NOTE) The Xpert Xpress SARS-CoV-2/FLU/RSV plus assay is intended as an aid in the diagnosis of influenza from Nasopharyngeal swab specimens and should not be used as a sole basis for treatment. Nasal washings and aspirates are unacceptable for Xpert Xpress SARS-CoV-2/FLU/RSV testing.  Fact Sheet for Patients: BloggerCourse.com  Fact Sheet for Healthcare Providers: SeriousBroker.it  This test is not yet approved or  cleared by the [place] FDA and has been authorized for detection and/or diagnosis of SARS-CoV-2 by FDA under an Emergency Use Authorization (EUA). This EUA will remain in effect (meaning this test can be used) for the duration of the COVID-19 declaration under Section 564(b)(1) of the Act, 21 U.S.C. section 360bbb-3(b)(1), unless the authorization is terminated or revoked.  Performed at [facility], [address], [place], [place] [address]      Labs: BNP (last 3 results) No results for input(s): BNP in the last 8760 hours. Basic Metabolic Panel: Recent Labs  Lab 04/30/21 0021  NA 138  K 3.5  CL 103  CO2 23  GLUCOSE 133*  BUN 9  CREATININE 0.94  CALCIUM 8.7*  MG 1.9  PHOS 4.2   Liver Function Tests: Recent Labs  Lab 04/30/21 0021  AST 46*  ALT 31  ALKPHOS 53  BILITOT 0.7  PROT 7.2  ALBUMIN 4.1   No results for input(s): LIPASE, AMYLASE in the last 168 hours. No results for input(s): AMMONIA in the last 168 hours. CBC: Recent Labs  Lab 04/30/21 0021  WBC 10.5  NEUTROABS 7.7  HGB 16.2  HCT 47.2  MCV 90.6  PLT 261   Cardiac Enzymes: No results for input(s): CKTOTAL, CKMB, CKMBINDEX, TROPONINI in the last 168 hours. BNP: Invalid input(s): POCBNP CBG: Recent Labs  Lab 04/30/21 0024  GLUCAP 134*   D-Dimer No results for input(s): DDIMER in the last 72 hours. Hgb A1c No results for input(s): HGBA1C in the last 72 hours. Lipid Profile No results for input(s): CHOL, HDL, LDLCALC, TRIG, CHOLHDL, LDLDIRECT in the last 72 hours. Thyroid function studies No results for input(s): TSH, T4TOTAL, T3FREE, THYROIDAB in the last 72 hours.  Invalid input(s): FREET3 Anemia work up No results for input(s): VITAMINB12, FOLATE, FERRITIN, TIBC, IRON, RETICCTPCT in the last 72 hours. Urinalysis    Component Value Date/Time   COLORURINE YELLOW 04/30/2021 1018   APPEARANCEUR CLEAR 04/30/2021 1018   LABSPEC 1.017 04/30/2021 1018    PHURINE 6.0 04/30/2021 1018   GLUCOSEU NEGATIVE 04/30/2021 1018   HGBUR NEGATIVE 04/30/2021 1018   BILIRUBINUR NEGATIVE 04/30/2021 1018   KETONESUR NEGATIVE 04/30/2021 1018   PROTEINUR NEGATIVE 04/30/2021 1018   NITRITE NEGATIVE 04/30/2021 1018   LEUKOCYTESUR NEGATIVE 04/30/2021 1018   Sepsis Labs Invalid input(s): PROCALCITONIN,  WBC,  LACTICIDVEN Microbiology Recent Results (from the past 240 hour(s))  Resp Panel by RT-PCR (Flu A&B, Covid) Nasopharyngeal Swab     Status: None   Collection Time: 04/30/21  3:15 AM   Specimen: Nasopharyngeal Swab; Nasopharyngeal(NP) swabs in vial transport medium  Result Value Ref Range Status   SARS Coronavirus 2 by RT PCR NEGATIVE NEGATIVE Final    Comment: (NOTE) SARS-CoV-2 target nucleic acids are NOT DETECTED.  The SARS-CoV-2 RNA is generally detectable in upper respiratory specimens during the acute phase of infection. The lowest concentration of SARS-CoV-2 viral  copies this assay can detect is 138 copies/mL. A negative result does not preclude SARS-Cov-2 infection and should not be used as the sole basis for treatment or other patient management decisions. A negative result may occur with  improper specimen collection/handling, submission of specimen other than nasopharyngeal swab, presence of viral mutation(s) within the areas targeted by this assay, and inadequate number of viral copies(<138 copies/mL). A negative result must be combined with clinical observations, patient history, and epidemiological information. The expected result is Negative.  Fact Sheet for Patients:  BloggerCourse.com  Fact Sheet for Healthcare Providers:  SeriousBroker.it  This test is no t yet approved or cleared by the [place] FDA and  has been authorized for detection and/or diagnosis of SARS-CoV-2 by FDA under an Emergency Use Authorization (EUA). This EUA will remain  in effect (meaning this test can  be used) for the duration of the COVID-19 declaration under Section 564(b)(1) of the Act, 21 U.S.C.section 360bbb-3(b)(1), unless the authorization is terminated  or revoked sooner.       Influenza A by PCR NEGATIVE NEGATIVE Final   Influenza B by PCR NEGATIVE NEGATIVE Final    Comment: (NOTE) The Xpert Xpress SARS-CoV-2/FLU/RSV plus assay is intended as an aid in the diagnosis of influenza from Nasopharyngeal swab specimens and should not be used as a sole basis for treatment. Nasal washings and aspirates are unacceptable for Xpert Xpress SARS-CoV-2/FLU/RSV testing.  Fact Sheet for Patients: BloggerCourse.com  Fact Sheet for Healthcare Providers: SeriousBroker.it  This test is not yet approved or cleared by the [place] FDA and has been authorized for detection and/or diagnosis of SARS-CoV-2 by FDA under an Emergency Use Authorization (EUA). This EUA will remain in effect (meaning this test can be used) for the duration of the COVID-19 declaration under Section 564(b)(1) of the Act, 21 U.S.C. section 360bbb-3(b)(1), unless the authorization is terminated or revoked.  Performed at [facility], [address] [place], [place] [address]      Time coordinating discharge:  SIGNED:   Erick Blinks, MD  [facility] 05/02/2021, 8:55 PM   If 7PM-7AM, please contact night-coverage [email or web address]

## 2021-09-04 ENCOUNTER — Other Ambulatory Visit: Payer: Self-pay

## 2021-09-04 ENCOUNTER — Ambulatory Visit (INDEPENDENT_AMBULATORY_CARE_PROVIDER_SITE_OTHER): Payer: BC Managed Care – PPO

## 2021-09-04 ENCOUNTER — Ambulatory Visit: Payer: BC Managed Care – PPO | Admitting: Pulmonary Disease

## 2021-09-04 ENCOUNTER — Encounter: Payer: Self-pay | Admitting: Pulmonary Disease

## 2021-09-04 VITALS — BP 118/74 | HR 95 | Ht 70.0 in | Wt 200.0 lb

## 2021-09-04 DIAGNOSIS — J45998 Other asthma: Secondary | ICD-10-CM

## 2021-09-04 DIAGNOSIS — R0609 Other forms of dyspnea: Secondary | ICD-10-CM

## 2021-09-04 DIAGNOSIS — U099 Post covid-19 condition, unspecified: Secondary | ICD-10-CM | POA: Diagnosis not present

## 2021-09-04 MED ORDER — FLUTICASONE PROPIONATE HFA 110 MCG/ACT IN AERO: 2.0000 | INHALATION_SPRAY | Freq: Two times a day (BID) | RESPIRATORY_TRACT | 12 refills | Status: DC

## 2021-09-04 NOTE — Progress Notes (Signed)
? ?Synopsis: Referred in March 2023 for shortness of breath by Dayton Scrape, NP ? ?Subjective:  ? ?PATIENT ID: Eric Nolan GENDER: male DOB: 1980-08-23, MRN: [id number] ? ?HPI ? ?Chief Complaint  ?Patient presents with  ? Consult  ?  Referred by PCP for increased SOB after having PNA and COVID. Had PNA back in November then had COVID in December 2023. Denies any coughing.   ? ?Eric Nolan is a 41 year old male, former smoker who is referred to pulmonary clinic for shortness of breath.  ? ?Patient reports having shortness of breath, chest pressure, sweating and lack of energy since having COVID-19 infection in December.  He was treated for pneumonia in November 2022. ? ?He notices the symptoms more so with exertion.  He reports significant family history of heart disease in his brother and father with reports of enlarged hearts and valvular issues.  He denies any autoimmune conditions in the family. ? ?Prior to COVID-19 infection he denies any issues with his breathing.  He has been using albuterol with relief. ? ?He is a former smoker and quit 15 years ago.  He smoked for 7 years using a pack per day.  He is also vaped CBD oil in the past.  He denies any current vaping or marijuana use.  He does drink 4-8 alcoholic beverages per day.  He denies history of withdrawal although he has not stopped drinking long enough to experience this. ? ?He is currently a Consulting civil engineer at [employer].  He works as a Airline pilot. ? ?Past Medical History:  ?Diagnosis Date  ? Depression   ? Gallstones   ?  ? ?Family History  ?Problem Relation Age of Onset  ? Valvular heart disease Father   ? Gout Father   ? Drug abuse Brother   ? Alzheimer's disease Maternal Grandmother   ? Heart attack Paternal Grandfather   ? Stroke Paternal Grandfather   ?  ? ?Social History  ? ?Socioeconomic History  ? Marital status: Single  ?  Spouse name: Not on file  ? Number of children: Not on file  ? Years of education: Not on file  ?  Highest education level: Not on file  ?Occupational History  ? Not on file  ?Tobacco Use  ? Smoking status: Former  ?  Packs/day: 1.00  ?  Types: Cigarettes  ?  Start date: 04/21/2000  ?  Quit date: 06/23/2005  ?  Years since quitting: 16.2  ? Smokeless tobacco: Never  ?Vaping Use  ? Vaping Use: Never used  ?Substance and Sexual Activity  ? Alcohol use: Yes  ?  Alcohol/week: 70.0 standard drinks  ?  Types: 70 Standard drinks or equivalent per week  ?  Comment: 5-10 drinks per day  ? Drug use: Yes  ? Sexual activity: Yes  ?Other Topics Concern  ? Not on file  ?Social History Narrative  ? Not on file  ? ?Social Determinants of Health  ? ?Financial Resource Strain: Not on file  ?Food Insecurity: Not on file  ?Transportation Needs: Not on file  ?Physical Activity: Not on file  ?Stress: Not on file  ?Social Connections: Not on file  ?Intimate Partner Violence: Not on file  ?  ? ?No Known Allergies  ? ?Outpatient Medications Prior to Visit  ?Medication Sig Dispense Refill  ? albuterol (VENTOLIN HFA) 108 (90 Base) MCG/ACT inhaler Inhale 2 puffs into the lungs every 6 (six) hours as needed for wheezing or shortness of breath. 8  g 2  ? buPROPion (WELLBUTRIN XL) 300 MG 24 hr tablet Take 300 mg by mouth every morning.    ? escitalopram (LEXAPRO) 10 MG tablet Take 15 mg by mouth daily.    ? folic acid (FOLVITE) 1 MG tablet Take 1 tablet (1 mg total) by mouth daily. 30 tablet 1  ? chlordiazePOXIDE (LIBRIUM) 25 MG capsule Take 1 capsule (25 mg total) by mouth every 6 (six) hours as needed for anxiety (tremors). 12 capsule 0  ? guaiFENesin (MUCINEX) 600 MG 12 hr tablet Take 1 tablet (600 mg total) by mouth 2 (two) times daily. 60 tablet 2  ? thiamine 100 MG tablet Take 1 tablet (100 mg total) by mouth daily. 30 tablet 1  ? ?No facility-administered medications prior to visit.  ? ?Review of Systems  ?Constitutional:  Negative for chills, fever, malaise/fatigue and weight loss.  ?HENT:  Negative for congestion, sinus pain and sore  throat.   ?Eyes: Negative.   ?Respiratory:  Positive for cough, shortness of breath and wheezing. Negative for hemoptysis and sputum production.   ?Cardiovascular:  Positive for chest pain. Negative for palpitations, orthopnea, claudication and leg swelling.  ?Gastrointestinal:  Negative for abdominal pain, heartburn, nausea and vomiting.  ?Genitourinary: Negative.   ?Musculoskeletal:  Negative for joint pain and myalgias.  ?Skin:  Negative for rash.  ?Neurological:  Negative for weakness.  ?Endo/Heme/Allergies: Negative.   ?Psychiatric/Behavioral: Negative.    ? ?Objective:  ? ?Vitals:  ? 09/04/21 1443  ?BP: 118/74  ?Pulse: 95  ?SpO2: 97%  ?Weight: 200 lb (90.7 kg)  ?Height: 5\' 10"  (1.778 m)  ? ?Physical Exam ?Constitutional:   ?   General: He is not in acute distress. ?HENT:  ?   Head: Normocephalic and atraumatic.  ?Eyes:  ?   Extraocular Movements: Extraocular movements intact.  ?   Conjunctiva/sclera: Conjunctivae normal.  ?   Pupils: Pupils are equal, round, and reactive to light.  ?Cardiovascular:  ?   Rate and Rhythm: Normal rate and regular rhythm.  ?   Pulses: Normal pulses.  ?   Heart sounds: Normal heart sounds. No murmur heard. ?Pulmonary:  ?   Effort: Pulmonary effort is normal.  ?   Breath sounds: Normal breath sounds. No wheezing, rhonchi or rales.  ?Abdominal:  ?   General: Bowel sounds are normal.  ?   Palpations: Abdomen is soft.  ?Musculoskeletal:  ?   Right lower leg: No edema.  ?   Left lower leg: No edema.  ?Lymphadenopathy:  ?   Cervical: No cervical adenopathy.  ?Skin: ?   General: Skin is warm and dry.  ?Neurological:  ?   General: No focal deficit present.  ?   Mental Status: He is alert.  ?Psychiatric:     ?   Mood and Affect: Mood normal.     ?   Behavior: Behavior normal.     ?   Thought Content: Thought content normal.     ?   Judgment: Judgment normal.  ? ?CBC ?   ?Component Value Date/Time  ? WBC 10.5 04/30/2021 0021  ? RBC 5.21 04/30/2021 0021  ? HGB 16.2 04/30/2021 0021  ? HCT 47.2  04/30/2021 0021  ? PLT 261 04/30/2021 0021  ? MCV 90.6 04/30/2021 0021  ? MCH 31.1 04/30/2021 0021  ? MCHC 34.3 04/30/2021 0021  ? RDW 14.4 04/30/2021 0021  ? LYMPHSABS 2.0 04/30/2021 0021  ? MONOABS 0.6 04/30/2021 0021  ? EOSABS 0.1 04/30/2021 0021  ? BASOSABS  0.1 04/30/2021 0021  ? ?Chest imaging: ?CXR 04/30/21 ?Diffuse left interstitial and peribronchial densities which may be ?chronic or represent atypical infection. Clinical correlation ?recommended. No focal consolidation, pleural effusion or ?pneumothorax. The cardiac silhouette is within limits. No acute ?osseous pathology. ? ?PFT: ?No flowsheet data found. ? ?Labs: ? ?Path: ? ?Echo: ? ?Heart Catheterization: ? ?Assessment & Plan:  ? ?Post-viral reactive airway disease - Plan: fluticasone (FLOVENT HFA) 110 MCG/ACT inhaler, Pulmonary Function Test, DG Chest 2 View ? ?Post-COVID chronic dyspnea - Plan: DG Chest 2 View ? ?Discussion: ?Eric Nolan is a 41 year old male, former smoker who is referred to pulmonary clinic for shortness of breath.  ? ?His shortness of breath, chest pressure and intermittent wheezing are likely secondary to postviral reactive airways disease.  He is to start Flovent 110 mcg 2 puffs twice daily and continue as needed albuterol. ? ?We will repeat chest radiograph today for comparison to his chest imaging from November. ? ?He is to follow-up in 4 to 6 weeks with pulmonary function test. ? ?Melody Comas, MD ?[facility] ?Office: [phone] ? ? ?Current Outpatient Medications:  ?  albuterol (VENTOLIN HFA) 108 (90 Base) MCG/ACT inhaler, Inhale 2 puffs into the lungs every 6 (six) hours as needed for wheezing or shortness of breath., Disp: 8 g, Rfl: 2 ?  buPROPion (WELLBUTRIN XL) 300 MG 24 hr tablet, Take 300 mg by mouth every morning., Disp: , Rfl:  ?  escitalopram (LEXAPRO) 10 MG tablet, Take 15 mg by mouth daily., Disp: , Rfl:  ?  fluticasone (FLOVENT HFA) 110 MCG/ACT inhaler, Inhale 2 puffs into the lungs 2  (two) times daily., Disp: 1 each, Rfl: 12 ?  folic acid (FOLVITE) 1 MG tablet, Take 1 tablet (1 mg total) by mouth daily., Disp: 30 tablet, Rfl: 1 ? ? ?

## 2021-09-04 NOTE — Patient Instructions (Addendum)
I am concerned you have post-viral reactive airways disease from your covid infection.  ? ?Start flovent inhaler 2 puffs twice daily ?- rinse mouth out after each use ? ?Continue to use albuterol as needed ? ?We will check a chest x- ray today. ? ?Follow up in 4 -6 weeks with pulmonary function tests ?

## 2021-11-06 ENCOUNTER — Encounter: Payer: Self-pay | Admitting: Pulmonary Disease

## 2021-11-06 ENCOUNTER — Ambulatory Visit: Payer: BC Managed Care – PPO | Admitting: Pulmonary Disease

## 2021-11-06 ENCOUNTER — Ambulatory Visit (INDEPENDENT_AMBULATORY_CARE_PROVIDER_SITE_OTHER): Payer: BC Managed Care – PPO | Admitting: Pulmonary Disease

## 2021-11-06 VITALS — BP 122/84 | HR 84 | Ht 70.0 in | Wt 200.4 lb

## 2021-11-06 DIAGNOSIS — J45998 Other asthma: Secondary | ICD-10-CM

## 2021-11-06 LAB — PULMONARY FUNCTION TEST
DL/VA % pred: 95 %
DL/VA: 4.5 ml/min/mmHg/L
DLCO cor % pred: 95 %
DLCO cor: 29.88 ml/min/mmHg
DLCO unc % pred: 95 %
DLCO unc: 29.88 ml/min/mmHg
FEF 25-75 Post: 3.64 L/sec
FEF 25-75 Pre: 3.97 L/sec
FEF2575-%Change-Post: -8 %
FEF2575-%Pred-Post: 89 %
FEF2575-%Pred-Pre: 97 %
FEV1-%Change-Post: -2 %
FEV1-%Pred-Post: 90 %
FEV1-%Pred-Pre: 92 %
FEV1-Post: 3.87 L
FEV1-Pre: 3.95 L
FEV1FVC-%Change-Post: -2 %
FEV1FVC-%Pred-Pre: 99 %
FEV6-%Change-Post: 1 %
FEV6-%Pred-Post: 95 %
FEV6-%Pred-Pre: 94 %
FEV6-Post: 4.99 L
FEV6-Pre: 4.93 L
FEV6FVC-%Pred-Post: 102 %
FEV6FVC-%Pred-Pre: 102 %
FVC-%Change-Post: 0 %
FVC-%Pred-Post: 93 %
FVC-%Pred-Pre: 92 %
FVC-Post: 4.99 L
FVC-Pre: 4.95 L
Post FEV1/FVC ratio: 78 %
Post FEV6/FVC ratio: 100 %
Pre FEV1/FVC ratio: 80 %
Pre FEV6/FVC Ratio: 100 %
RV % pred: 56 %
RV: 1.01 L
TLC % pred: 88 %
TLC: 6.1 L

## 2021-11-06 NOTE — Patient Instructions (Signed)
Your breathing tests are normal today. Your chest x-ray was normal at last visit. ? ?Continue flovent 2 puffs twice daily ?- rinse mouth out after each use  ? ?Continue to use albuterol inhaler 1-2 puffs every 4-6 hours as needed ? ?Follow up in 6 months via video visit ? ?

## 2021-11-06 NOTE — Progress Notes (Signed)
? ?Synopsis: Referred in March 2023 for shortness of breath by [place] Scrapeakela Anderson, NP ? ?Subjective:  ? ?PATIENT ID: Eric Nolan, Eric Nolan ? ?HPI ? ?Chief Complaint  ?Patient presents with  ? Follow-up  ?  F/U after PFT. States he is still getting more SOB with exertion.   ? ?Eric Nolan is a 41 year old male, former smoker who is referred to pulmonary clinic for reactive airways disease.  ? ?His breathing has improved since last visit. He does not notice cough or wheezing. He has exertional dyspnea intermittently. He continues to have on going issues with anxiety and panic attacks which lead to shortness of breath symptoms. ? ?He continues on flovent 110mcg 2 puffs twice daily and albuterol as needed. ? ?PFTs today are within normal limits.  ? ?OV 09/04/21 ?Patient reports having shortness of breath, chest pressure, sweating and lack of energy since having COVID-19 infection in December.  He was treated for pneumonia in November 2022. ? ?He notices the symptoms more so with exertion.  He reports significant family history of heart disease in his brother and father with reports of enlarged hearts and valvular issues.  He denies any autoimmune conditions in the family. ? ?Prior to COVID-19 infection he denies any issues with his breathing.  He has been using albuterol with relief. ? ?He is a former smoker and quit 15 years ago.  He smoked for 7 years using a pack per day.  He is also vaped CBD oil in the past.  He denies any current vaping or marijuana use.  He does drink 4-8 alcoholic beverages per day.  He denies history of withdrawal although he has not stopped drinking long enough to experience this. ? 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  He works as a Airline pilotwaiter. ? ?Past Medical History:  ?Diagnosis Date  ? Depression   ? Gallstones   ?  ? ?Family History  ?Problem Relation Age of Onset  ? Valvular heart disease Father   ? Gout Father   ? Drug abuse  Brother   ? Alzheimer's disease Maternal Grandmother   ? Heart attack Paternal Grandfather   ? Stroke Paternal Grandfather   ?  ? ?Social History  ? ?Socioeconomic History  ? Marital status: Single  ?  Spouse name: Not on file  ? Number of children: Not on file  ? Years of education: Not on file  ? Highest education level: Not on file  ?Occupational History  ? Not on file  ?Tobacco Use  ? Smoking status: Former  ?  Packs/day: 1.00  ?  Types: Cigarettes  ?  Start date: 04/21/2000  ?  Quit date: 06/23/2005  ?  Years since quitting: 16.3  ? Smokeless tobacco: Never  ?Vaping Use  ? Vaping Use: Never used  ?Substance and Sexual Activity  ? Alcohol use: Yes  ?  Alcohol/week: 70.0 standard drinks  ?  Types: 70 Standard drinks or equivalent per week  ?  Comment: 5-10 drinks per day  ? Drug use: Yes  ? Sexual activity: Yes  ?Other Topics Concern  ? Not on file  ?Social History Narrative  ? Not on file  ? ?Social Determinants of Health  ? ?Financial Resource Strain: Not on file  ?Food Insecurity: Not on file  ?Transportation Needs: Not on file  ?Physical Activity: Not on file  ?Stress: Not on file  ?Social Connections: Not on file  ?Intimate Partner Violence: Not  on file  ?  ? ?No Known Allergies  ? ?Outpatient Medications Prior to Visit  ?Medication Sig Dispense Refill  ? albuterol (VENTOLIN HFA) 108 (90 Base) MCG/ACT inhaler Inhale 2 puffs into the lungs every 6 (six) hours as needed for wheezing or shortness of breath. 8 g 2  ? buPROPion (WELLBUTRIN XL) 300 MG 24 hr tablet Take 300 mg by mouth every morning.    ? escitalopram (LEXAPRO) 10 MG tablet Take 15 mg by mouth daily.    ? fluticasone (FLOVENT HFA) 110 MCG/ACT inhaler Inhale 2 puffs into the lungs 2 (two) times daily. 1 each 12  ? folic acid (FOLVITE) 1 MG tablet Take 1 tablet (1 mg total) by mouth daily. 30 tablet 1  ? ?No facility-administered medications prior to visit.  ? ?Review of Systems  ?Constitutional:  Negative for chills, fever, malaise/fatigue and weight  loss.  ?HENT:  Negative for congestion, sinus pain and sore throat.   ?Eyes: Negative.   ?Respiratory:  Positive for shortness of breath. Negative for cough, hemoptysis, sputum production and wheezing.   ?Cardiovascular:  Negative for chest pain, palpitations, orthopnea, claudication and leg swelling.  ?Gastrointestinal:  Negative for abdominal pain, heartburn, nausea and vomiting.  ?Genitourinary: Negative.   ?Musculoskeletal:  Negative for joint pain and myalgias.  ?Skin:  Negative for rash.  ?Neurological:  Negative for weakness.  ?Endo/Heme/Allergies: Negative.   ?Psychiatric/Behavioral: Negative.    ? ?Objective:  ? ?Vitals:  ? 11/06/21 1556  ?BP: 122/84  ?Pulse: 84  ?SpO2: 96%  ?Weight: 200 lb 6.4 oz (90.9 kg)  ?Height: 5\' 10"  (1.778 m)  ? ? ?Physical Exam ?Constitutional:   ?   General: He is not in acute distress. ?HENT:  ?   Head: Normocephalic and atraumatic.  ?Eyes:  ?   Conjunctiva/sclera: Conjunctivae normal.  ?Cardiovascular:  ?   Rate and Rhythm: Normal rate and regular rhythm.  ?   Pulses: Normal pulses.  ?   Heart sounds: Normal heart sounds. No murmur heard. ?Pulmonary:  ?   Effort: Pulmonary effort is normal.  ?   Breath sounds: Normal breath sounds. No wheezing, rhonchi or rales.  ?Musculoskeletal:  ?   Right lower leg: No edema.  ?   Left lower leg: No edema.  ?Skin: ?   General: Skin is warm and dry.  ?Neurological:  ?   General: No focal deficit present.  ?   Mental Status: He is alert.  ?Psychiatric:     ?   Mood and Affect: Mood normal.     ?   Behavior: Behavior normal.     ?   Thought Content: Thought content normal.     ?   Judgment: Judgment normal.  ? ?CBC ?   ?Component Value Date/Time  ? WBC 10.5 04/30/2021 0021  ? RBC 5.21 04/30/2021 0021  ? HGB 16.2 04/30/2021 0021  ? HCT 47.2 04/30/2021 0021  ? PLT 261 04/30/2021 0021  ? MCV 90.6 04/30/2021 0021  ? MCH 31.1 04/30/2021 0021  ? MCHC 34.3 04/30/2021 0021  ? RDW 14.4 04/30/2021 0021  ? LYMPHSABS 2.0 04/30/2021 0021  ? MONOABS 0.6  04/30/2021 0021  ? EOSABS 0.1 04/30/2021 0021  ? BASOSABS 0.1 04/30/2021 0021  ? ?Chest imaging: ?CXR 09/04/21 ?No acute cardiopulmonary disease. ? ?CXR 04/30/21 ?Diffuse left interstitial and peribronchial densities which may be ?chronic or represent atypical infection. Clinical correlation ?recommended. No focal consolidation, pleural effusion or ?pneumothorax. The cardiac silhouette is within limits. No acute ?osseous  pathology. ? ?PFT: ? ?  Latest Ref Rng & Units 11/06/2021  ?  2:58 PM  ?PFT Results  ?FVC-Pre L 4.95  P  ?FVC-Predicted Pre % 92  P  ?FVC-Post L 4.99  P  ?FVC-Predicted Post % 93  P  ?Pre FEV1/FVC % % 80  P  ?Post FEV1/FCV % % 78  P  ?FEV1-Pre L 3.95  P  ?FEV1-Predicted Pre % 92  P  ?FEV1-Post L 3.87  P  ?DLCO uncorrected ml/min/mmHg 29.88  P  ?DLCO UNC% % 95  P  ?DLCO corrected ml/min/mmHg 29.88  P  ?DLCO COR %Predicted % 95  P  ?DLVA Predicted % 95  P  ?TLC L 6.10  P  ?TLC % Predicted % 88  P  ?RV % Predicted % 56  P  ?  ?P Preliminary result  ? ? ?Labs: ? ?Path: ? ?Echo: ? ?Heart Catheterization: ? ?Assessment & Plan:  ? ?Post-viral reactive airway disease ? ?Discussion: ?Vashon Riordan is a 41 year old male, former smoker who returns to pulmonary clinic for reactive airways disease.  ? ?He is to continue Flovent 110 mcg 2 puffs twice daily and continue as needed albuterol. ? ?PFTs are within normal limits and his chest radiograph from last visit is unremarkable. ? ?Encouraged increasing his physical activity and working on weight loss. ? ?He is to follow-up in 6 months via video visit. ? ?Melody Comas, MD ?[facility] ?Office: [phone] ? ? ?Current Outpatient Medications:  ?  albuterol (VENTOLIN HFA) 108 (90 Base) MCG/ACT inhaler, Inhale 2 puffs into the lungs every 6 (six) hours as needed for wheezing or shortness of breath., Disp: 8 g, Rfl: 2 ?  buPROPion (WELLBUTRIN XL) 300 MG 24 hr tablet, Take 300 mg by mouth every morning., Disp: , Rfl:  ?  escitalopram (LEXAPRO)  10 MG tablet, Take 15 mg by mouth daily., Disp: , Rfl:  ?  fluticasone (FLOVENT HFA) 110 MCG/ACT inhaler, Inhale 2 puffs into the lungs 2 (two) times daily., Disp: 1 each, Rfl: 12 ?  folic acid (FOLVITE) 1

## 2021-11-06 NOTE — Patient Instructions (Addendum)
Performed Full PFT Today.   ?

## 2021-11-06 NOTE — Progress Notes (Signed)
Performed Full PFT Today.   ?

## 2021-11-17 ENCOUNTER — Encounter (HOSPITAL_COMMUNITY): Payer: Self-pay

## 2021-11-17 ENCOUNTER — Emergency Department (HOSPITAL_COMMUNITY): Payer: BC Managed Care – PPO

## 2021-11-17 ENCOUNTER — Emergency Department (HOSPITAL_COMMUNITY)
Admission: EM | Admit: 2021-11-17 | Discharge: 2021-11-17 | Disposition: A | Discharge status: Home / Self Care | Payer: BC Managed Care – PPO | Attending: Emergency Medicine | Admitting: Emergency Medicine

## 2021-11-17 ENCOUNTER — Other Ambulatory Visit: Payer: Self-pay

## 2021-11-17 DIAGNOSIS — R000 Tachycardia, unspecified: Secondary | ICD-10-CM | POA: Insufficient documentation

## 2021-11-17 DIAGNOSIS — R1013 Epigastric pain: Secondary | ICD-10-CM

## 2021-11-17 DIAGNOSIS — D72829 Elevated white blood cell count, unspecified: Secondary | ICD-10-CM | POA: Diagnosis not present

## 2021-11-17 DIAGNOSIS — R112 Nausea with vomiting, unspecified: Secondary | ICD-10-CM | POA: Diagnosis present

## 2021-11-17 DIAGNOSIS — R824 Acetonuria: Secondary | ICD-10-CM | POA: Diagnosis not present

## 2021-11-17 LAB — URINALYSIS, ROUTINE W REFLEX MICROSCOPIC
Bilirubin Urine: NEGATIVE
Glucose, UA: NEGATIVE mg/dL
Hgb urine dipstick: NEGATIVE
Ketones, ur: 5 mg/dL — AB
Leukocytes,Ua: NEGATIVE
Nitrite: NEGATIVE
Protein, ur: NEGATIVE mg/dL
Specific Gravity, Urine: 1.019 (ref 1.005–1.030)
pH: 7 (ref 5.0–8.0)

## 2021-11-17 LAB — COMPREHENSIVE METABOLIC PANEL
ALT: 34 U/L (ref 0–44)
AST: 35 U/L (ref 15–41)
Albumin: 4.1 g/dL (ref 3.5–5.0)
Alkaline Phosphatase: 55 U/L (ref 38–126)
Anion gap: 8 (ref 5–15)
BUN: 11 mg/dL (ref 6–20)
CO2: 28 mmol/L (ref 22–32)
Calcium: 9.2 mg/dL (ref 8.9–10.3)
Chloride: 100 mmol/L (ref 98–111)
Creatinine, Ser: 0.97 mg/dL (ref 0.61–1.24)
GFR, Estimated: >60 mL/min (ref >60)
Glucose, Bld: 133 mg/dL — ABNORMAL HIGH (ref 70–99)
Potassium: 3.6 mmol/L (ref 3.5–5.1)
Sodium: 136 mmol/L (ref 135–145)
Total Bilirubin: 0.8 mg/dL (ref 0.3–1.2)
Total Protein: 7.2 g/dL (ref 6.5–8.1)

## 2021-11-17 LAB — CBC WITH DIFFERENTIAL/PLATELET
Abs Immature Granulocytes: 0.05 10*3/uL (ref 0.00–0.07)
Basophils Absolute: 0 10*3/uL (ref 0.0–0.1)
Basophils Relative: 0 %
Eosinophils Absolute: 0 10*3/uL (ref 0.0–0.5)
Eosinophils Relative: 0 %
HCT: 46.3 % (ref 39.0–52.0)
Hemoglobin: 16.5 g/dL (ref 13.0–17.0)
Immature Granulocytes: 1 %
Lymphocytes Relative: 11 %
Lymphs Abs: 1.2 10*3/uL (ref 0.7–4.0)
MCH: 32.7 pg (ref 26.0–34.0)
MCHC: 35.6 g/dL (ref 30.0–36.0)
MCV: 91.9 fL (ref 80.0–100.0)
Monocytes Absolute: 0.9 10*3/uL (ref 0.1–1.0)
Monocytes Relative: 8 %
Neutro Abs: 8.7 10*3/uL — ABNORMAL HIGH (ref 1.7–7.7)
Neutrophils Relative %: 80 %
Platelets: 283 10*3/uL (ref 150–400)
RBC: 5.04 MIL/uL (ref 4.22–5.81)
RDW: 12.9 % (ref 11.5–15.5)
WBC: 10.8 10*3/uL — ABNORMAL HIGH (ref 4.0–10.5)
nRBC: 0 % (ref 0.0–0.2)

## 2021-11-17 LAB — LIPASE, BLOOD: Lipase: 21 U/L (ref 11–51)

## 2021-11-17 LAB — TROPONIN I (HIGH SENSITIVITY): Troponin I (High Sensitivity): <2 ng/L (ref <18)

## 2021-11-17 MED ORDER — ONDANSETRON HCL 4 MG/2ML IJ SOLN
4.0000 mg | Freq: Once | INTRAMUSCULAR | Status: AC
  Administered 2021-11-17: 4 mg via INTRAVENOUS
  Filled 2021-11-17: qty 2

## 2021-11-17 MED ORDER — SODIUM CHLORIDE 0.9 % IV BOLUS
1000.0000 mL | Freq: Once | INTRAVENOUS | Status: AC
  Administered 2021-11-17: 1000 mL via INTRAVENOUS

## 2021-11-17 MED ORDER — OMEPRAZOLE 20 MG PO CPDR: 40.0000 mg | DELAYED_RELEASE_CAPSULE | Freq: Every day | ORAL | 0 refills | Status: AC

## 2021-11-17 MED ORDER — LIDOCAINE VISCOUS HCL 2 % MT SOLN
15.0000 mL | Freq: Once | OROMUCOSAL | Status: AC
Start: 2021-11-17 — End: 2021-11-17
  Administered 2021-11-17: 15 mL via ORAL
  Filled 2021-11-17: qty 15

## 2021-11-17 MED ORDER — SUCRALFATE 1 G PO TABS
1.0000 g | ORAL_TABLET | Freq: Once | ORAL | Status: AC
  Administered 2021-11-17: 1 g via ORAL
  Filled 2021-11-17: qty 1

## 2021-11-17 MED ORDER — SUCRALFATE 1 G PO TABS: 1.0000 g | ORAL_TABLET | Freq: Three times a day (TID) | ORAL | 0 refills | Status: DC

## 2021-11-17 MED ORDER — ALUM & MAG HYDROXIDE-SIMETH 200-200-20 MG/5ML PO SUSP
30.0000 mL | Freq: Once | ORAL | Status: AC
  Administered 2021-11-17: 30 mL via ORAL
  Filled 2021-11-17: qty 30

## 2021-11-17 NOTE — ED Triage Notes (Signed)
Pt reports with abdominal pain, vomiting, and hiccups x 2 days. Pt states that he is spitting up thick brown vomit. Pt reports drinking 5 drinks of alcohol a night but has not been drinking since this started happening. Pt states that it hurts to eat or drink.

## 2021-11-17 NOTE — ED Provider Notes (Signed)
[facility]-EMERGENCY DEPT Provider Note   CSN: [id number] Arrival date & time: 11/17/21  [id number]     History PMH: alcohol use disorder, hx of opioid use disorder now on suboxone, depression, hx cholecystectomy  Chief Complaint  Patient presents with   Abdominal Pain   Emesis    Eric Nolan is a 41 y.o. male.   Patient presents to the ED with a chief complaint of epigastric abdominal pain, nausea, and vomiting.  He states that he started experiencing a burning in his epigastric region about 3 days ago.  This gradually got worse to where it became more painful.  He states he then started having nausea and vomiting and unable to keep any foods or fluids down.  He has been throwing up a dark bile appearing emesis.  Denies any blood in his vomit that he can knows of. He states that the pain hurts worse when he swallows. He states he was up all night vomiting and last had an episode just prior to arrival. He says he last ate or drink anything about 3 to 4 days ago.  He does have a history of alcohol use disorder and most recently had a drink about 4 days ago.  He states he tried to have a drink last night, but was unable to keep it down.  He has never had any withdrawal symptoms in the past and does not think that he is having any withdrawal symptoms today. His typical consumption of alcohol is 4-8 alcoholic drinks daily for several years.  No hx of pancreatitis, gastric ulcer, or esophagitis. No hx of cirrhosis or liver disease.   Abdominal Pain Associated symptoms: nausea and vomiting   Associated symptoms: no chest pain, no chills, no fever and no shortness of breath   Emesis Associated symptoms: abdominal pain   Associated symptoms: no chills and no fever       Home Medications Prior to Admission medications   Medication Sig Start Date End Date Taking? Authorizing Provider  omeprazole (PRILOSEC) 20 MG capsule Take 2 capsules (40 mg total) by mouth daily. 11/17/21  12/17/21 Yes Lea Baine, Finis BudGrace C, PA-C  sucralfate (CARAFATE) 1 g tablet Take 1 tablet (1 g total) by mouth 4 (four) times daily -  with meals and at bedtime for 14 days. 11/17/21 12/01/21 Yes Tonishia Steffy, Finis BudGrace C, PA-C  albuterol (VENTOLIN HFA) 108 (90 Base) MCG/ACT inhaler Inhale 2 puffs into the lungs every 6 (six) hours as needed for wheezing or shortness of breath. 05/01/21   Erick BlinksMemon, Jehanzeb, MD  buPROPion (WELLBUTRIN XL) 300 MG 24 hr tablet Take 300 mg by mouth every morning. 04/14/21   [provider]  escitalopram (LEXAPRO) 10 MG tablet Take 15 mg by mouth daily. 04/14/21   [provider]  fluticasone (FLOVENT HFA) 110 MCG/ACT inhaler Inhale 2 puffs into the lungs 2 (two) times daily. 09/04/21   Martina Sinnerewald, Jonathan B, MD  folic acid (FOLVITE) 1 MG tablet Take 1 tablet (1 mg total) by mouth daily. 05/02/21   Erick BlinksMemon, Jehanzeb, MD      Allergies    Patient has no known allergies.    Review of Systems   Review of Systems  Constitutional:  Negative for chills and fever.  Respiratory:  Negative for shortness of breath.   Cardiovascular:  Negative for chest pain.  Gastrointestinal:  Positive for abdominal pain, nausea and vomiting. Negative for abdominal distention.  All other systems reviewed and are negative.  Physical Exam Updated Vital Signs  BP 113/82   Pulse 83   Temp 98.8 F (37.1 C) (Oral)   Resp 13   Ht 5\' 10"  (1.778 m)   Wt 90.7 kg   SpO2 92%   BMI 28.70 kg/m  Physical Exam Vitals and nursing note reviewed.  Constitutional:      General: He is not in acute distress.    Appearance: Normal appearance. He is not ill-appearing, toxic-appearing or diaphoretic.  HENT:     Head: Normocephalic and atraumatic.     Nose: No nasal deformity.     Mouth/Throat:     Lips: Pink. No lesions.     Mouth: No injury, lacerations, oral lesions or angioedema.     Pharynx: Uvula midline. No uvula swelling.  Eyes:     General: Gaze aligned appropriately. No scleral icterus.        Right eye: No discharge.        Left eye: No discharge.     Conjunctiva/sclera: Conjunctivae normal.     Right eye: Right conjunctiva is not injected. No exudate or hemorrhage.    Left eye: Left conjunctiva is not injected. No exudate or hemorrhage. Cardiovascular:     Rate and Rhythm: Regular rhythm. Tachycardia present.     Pulses: Normal pulses.          Radial pulses are 2+ on the right side and 2+ on the left side.       Dorsalis pedis pulses are 2+ on the right side and 2+ on the left side.     Heart sounds: Normal heart sounds, S1 normal and S2 normal. Heart sounds not distant. No murmur heard.   No friction rub. No gallop. No S3 or S4 sounds.  Pulmonary:     Effort: Pulmonary effort is normal. No accessory muscle usage or respiratory distress.     Breath sounds: Normal breath sounds. No stridor. No wheezing, rhonchi or rales.  Chest:     Chest wall: No tenderness.  Abdominal:     General: Abdomen is flat. There is no distension.     Palpations: Abdomen is soft. There is no mass or pulsatile mass.     Tenderness: There is abdominal tenderness in the epigastric area. There is no right CVA tenderness, left CVA tenderness, guarding or rebound. Negative signs include Murphy's sign, Rovsing's sign and McBurney's sign.     Comments: Mild tenderness to the epigastric region. No guarding or rigidity. Negative Murphies sign. No other focal abdominal tenderness.  Musculoskeletal:     Right lower leg: No edema.     Left lower leg: No edema.  Skin:    General: Skin is warm and dry.     Coloration: Skin is not jaundiced or pale.     Findings: No bruising, erythema, lesion or rash.  Neurological:     General: No focal deficit present.     Mental Status: He is alert and oriented to person, place, and time.     GCS: GCS eye subscore is 4. GCS verbal subscore is 5. GCS motor subscore is 6.  Psychiatric:        Mood and Affect: Mood normal.        Behavior: Behavior normal. Behavior is  cooperative.    ED Results / Procedures / Treatments   Labs (all labs ordered are listed, but only abnormal results are displayed) Labs Reviewed  CBC WITH DIFFERENTIAL/PLATELET - Abnormal; Notable for the following components:      Result Value   WBC 10.8 (*)  Neutro Abs 8.7 (*)    All other components within normal limits  COMPREHENSIVE METABOLIC PANEL - Abnormal; Notable for the following components:   Glucose, Bld 133 (*)    All other components within normal limits  URINALYSIS, ROUTINE W REFLEX MICROSCOPIC - Abnormal; Notable for the following components:   Ketones, ur 5 (*)    All other components within normal limits  LIPASE, BLOOD  TROPONIN I (HIGH SENSITIVITY)    EKG None  Radiology DG Chest 2 View  Result Date: 11/17/2021 CLINICAL DATA:  Shortness of breath and chest pain EXAM: CHEST - 2 VIEW COMPARISON:  09/04/2021 FINDINGS: The cardiomediastinal silhouette is unremarkable. Subsegmental bibasilar noted. There is no evidence of focal airspace disease, pulmonary edema, suspicious pulmonary nodule/mass, pleural effusion, or pneumothorax. No acute bony abnormalities are identified. IMPRESSION: Subsegmental bibasilar atelectasis. Electronically Signed   By: Harmon Pier M.D.   On: 11/17/2021 07:41    Procedures Procedures  This patient was on telemetry or cardiac monitoring during their time in the ED.    Medications Ordered in ED Medications  sodium chloride 0.9 % bolus 1,000 mL (0 mLs Intravenous Stopped 11/17/21 0839)  alum & mag hydroxide-simeth (MAALOX/MYLANTA) 200-200-20 MG/5ML suspension 30 mL (30 mLs Oral Given 11/17/21 0721)    And  lidocaine (XYLOCAINE) 2 % viscous mouth solution 15 mL (15 mLs Oral Given 11/17/21 0721)  sucralfate (CARAFATE) tablet 1 g (1 g Oral Given 11/17/21 0720)  ondansetron (ZOFRAN) injection 4 mg (4 mg Intravenous Given 11/17/21 0720)    ED Course/ Medical Decision Making/ A&P Clinical Course as of 11/17/21 1032  Sun Nov 17, 2021  0750  Tachycardia has improved.  [GL]    Clinical Course User Index [GL] Victorino Dike Finis Bud, PA-C                           Medical Decision Making Amount and/or Complexity of Data Reviewed Labs: ordered. Radiology: ordered.  Risk OTC drugs. Prescription drug management.    MDM  This is a 41 y.o. male who presents to the ED with epigastric abdominal pain, nausea, and vomiting The differential of this patient includes but is not limited to Esophagitis, gastritis, esophageal varices, Borhaaves, Pancreatitis, gall bladder etiology, alcoholic ketoacidosis, PUD, SBO, alcohol withdrawal, ect.   My Impression, Plan, and ED Course:  Patient is afebrile.  He has tachycardic to 124.  Vitals are hemodynamically stable.  Given his history, he actually looks quite well and does not appear to be actively vomiting.  At this point, I have low suspicion for Boerhaave's, small bowel obstruction, or esophageal varices.  He does have a history of cholecystectomy so gallbladder etiology is less likely with negative Murphy sign.  He does not appear to be having signs of withdrawal.  Plan to initiate abdominal work-up.  GI cocktail provided along with Zofran and IV fluids. At this point, I do not think he requires CT imaging. Will reassess following interventions.   I personally ordered, reviewed, and interpreted all laboratory work and imaging and agree with radiologist interpretation. Results interpreted below:  CBC with slight leukocytosis to 10.8 which is nonspecific and close to baseline. CMP reassuring with no acidosis, AKI, or electrolyte abnormalities. Urine has 5 ketones but otherwise reassuring. Lipase normal. Troponin normal.  CXR with no acute findings EKG with ST to 100 bpm. Non ischemic   Reassessment reveals that symptoms have improved after GI cocktail. He has not vomited while here. He has  tolerated fluids. Abdominal exam remains reassuring. Tachycardia has completely resolved. Still no signs of  alcohol withdrawal today. I suspect that this is esophagitis, versus PUD versus Gastritis from alcohol use. He does not require admission today. Will place on PPI and carafate. F/u with PCP and gastroenterology. Return precautions are provided.   Charting Requirements Additional history is obtained from:  Independent historian External Records from outside source obtained and reviewed including: Reviewed prior labs. Reviewed admission from last November of 22. Reviewed recent office visit with pulmonology for reactive airway disease Social Determinants of Health:  Alcoholism/Drug Addiction Pertinant PMH that complicates patient's illness: alcohol use disorder  Patient Care Problems that were addressed during this visit: - Epigastric Abdominal Pain: Acute illness with systemic symptoms - Nausea/Vomiting: Acute illness with systemic symptoms This patient was maintained on a cardiac monitor/telemetry. I personally viewed and interpreted the cardiac monitor which reveals an underlying rhythm of NSR/ST Medications given in ED: GI cocktail, IVF Reevaluation of the patient after these medicines showed that the patient improved I have reviewed home medications and made changes accordingly. Started on PPI and carafate Disposition: Discharge with supportive treatment and GI f/u   Portions of this note were generated with Dragon dictation software. Dictation errors may occur despite best attempts at proofreading.      Final Clinical Impression(s) / ED Diagnoses Final diagnoses:  Epigastric abdominal pain  Nausea and vomiting, unspecified vomiting type    Rx / DC Orders ED Discharge Orders          Ordered    omeprazole (PRILOSEC) 20 MG capsule  Daily        11/17/21 0928    sucralfate (CARAFATE) 1 g tablet  3 times daily with meals & bedtime        11/17/21 0928              Claudie Leach, PA-C 11/17/21 1032    Derwood Kaplan, MD 11/21/21 1114

## 2021-11-17 NOTE — Discharge Instructions (Signed)
Please start taking omeprazole daily and carafate as needed. I have sent these to your pharmacy.  Please call [facility] for follow up appointment for consideration of endoscopy.  Please follow up with your PCP for reevaluation.  Please return if you start vomiting large amounts of blood, develop severely worsening pain, or develop other new or concerning symptoms, please return to the ED.

## 2021-11-17 NOTE — ED Notes (Signed)
Pt states understanding of dc instructions, importance of follow up, and prescription. Pt denies questions or concerns upon dc. Pt declined wheelchair assistance upon dc. Pt ambulated out of ed w/ steady gait. No belongings left in room upon dc.  

## 2021-11-17 NOTE — ED Notes (Signed)
Pt given gingerale for PO challenge 

## 2022-11-03 IMAGING — CR DG CHEST 2V
2 series · 2 of 2 positions shown · non-contrast
Comparison: 09/04/2021

CLINICAL DATA: Shortness of breath and chest pain

EXAM:
CHEST - 2 VIEW

[w chest pa]
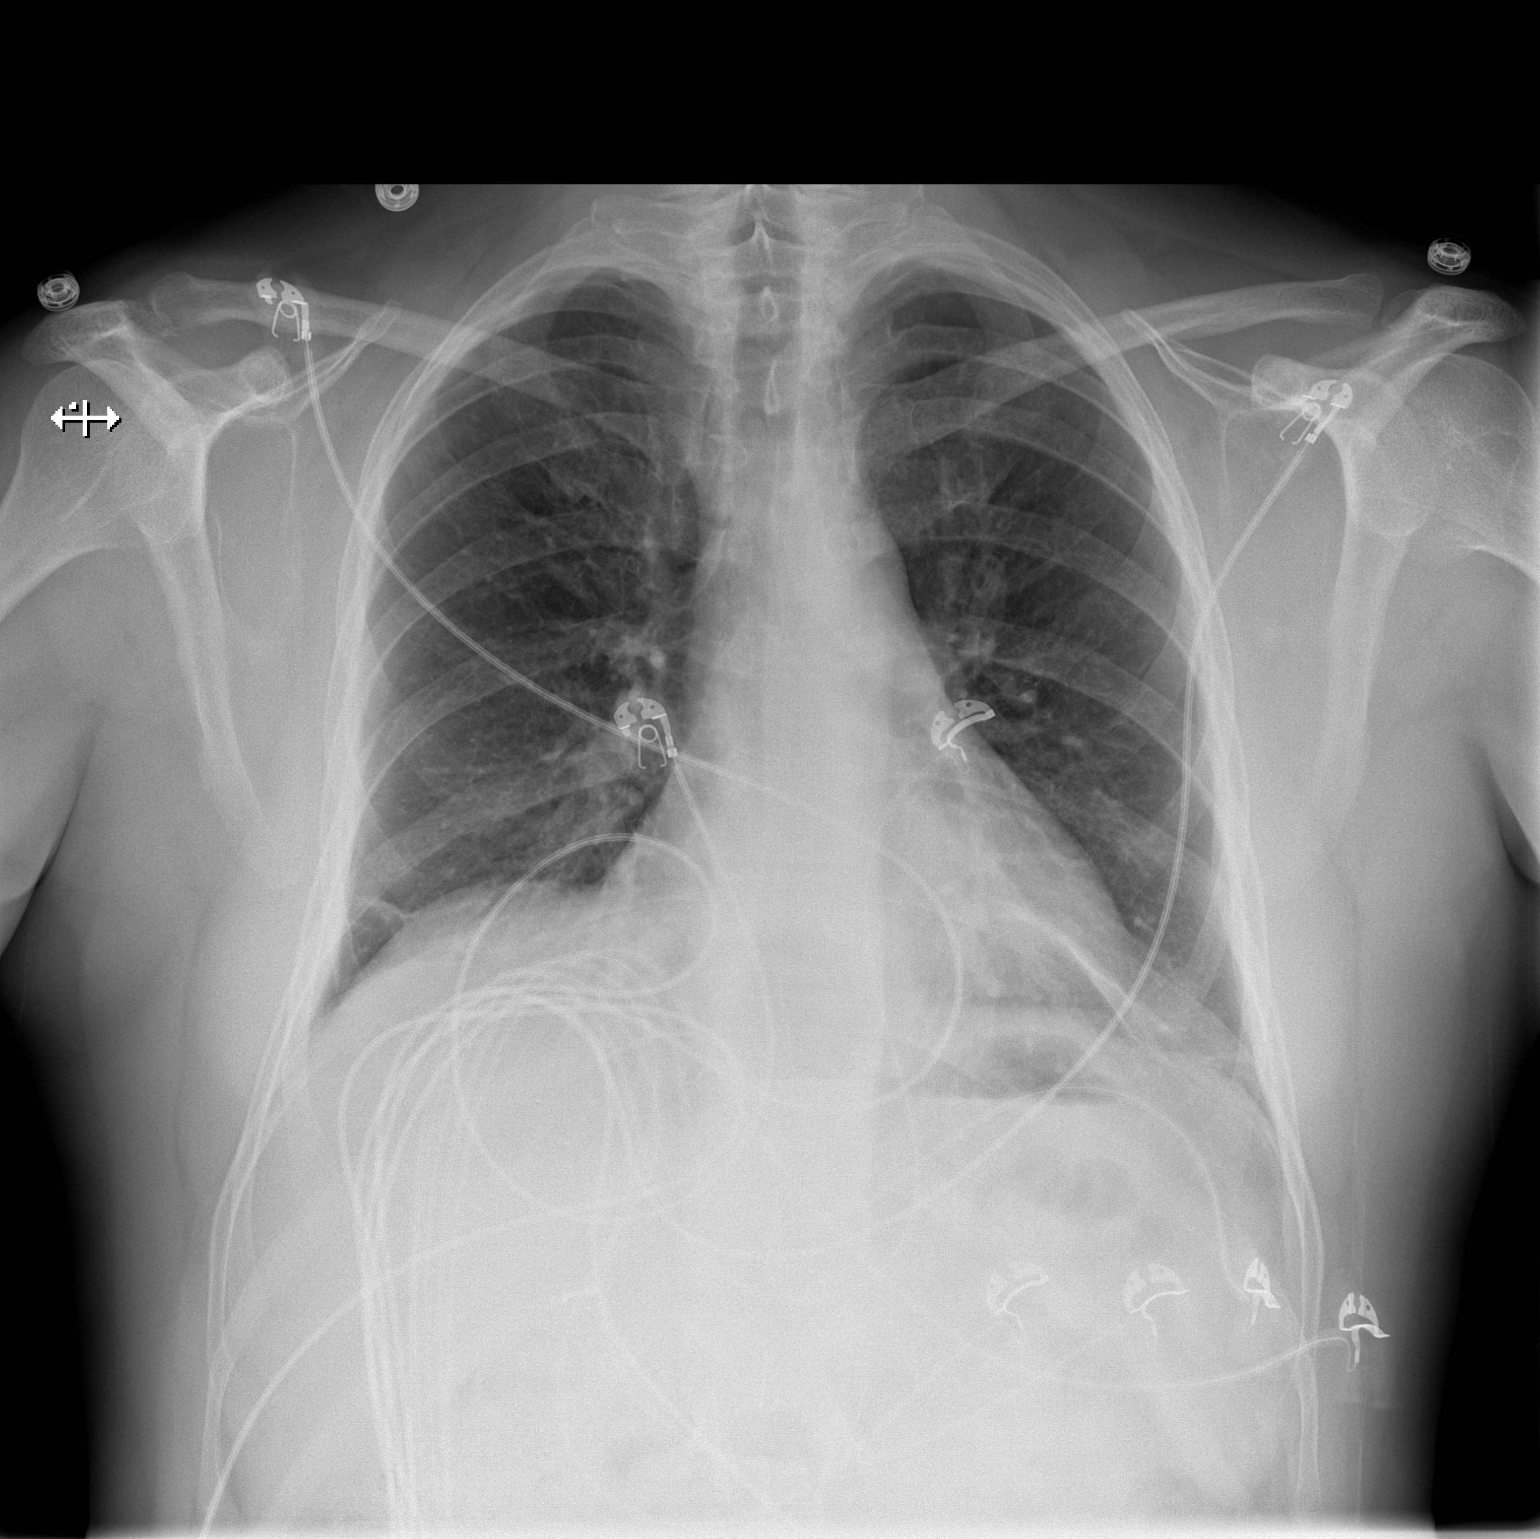

[w chest lat]
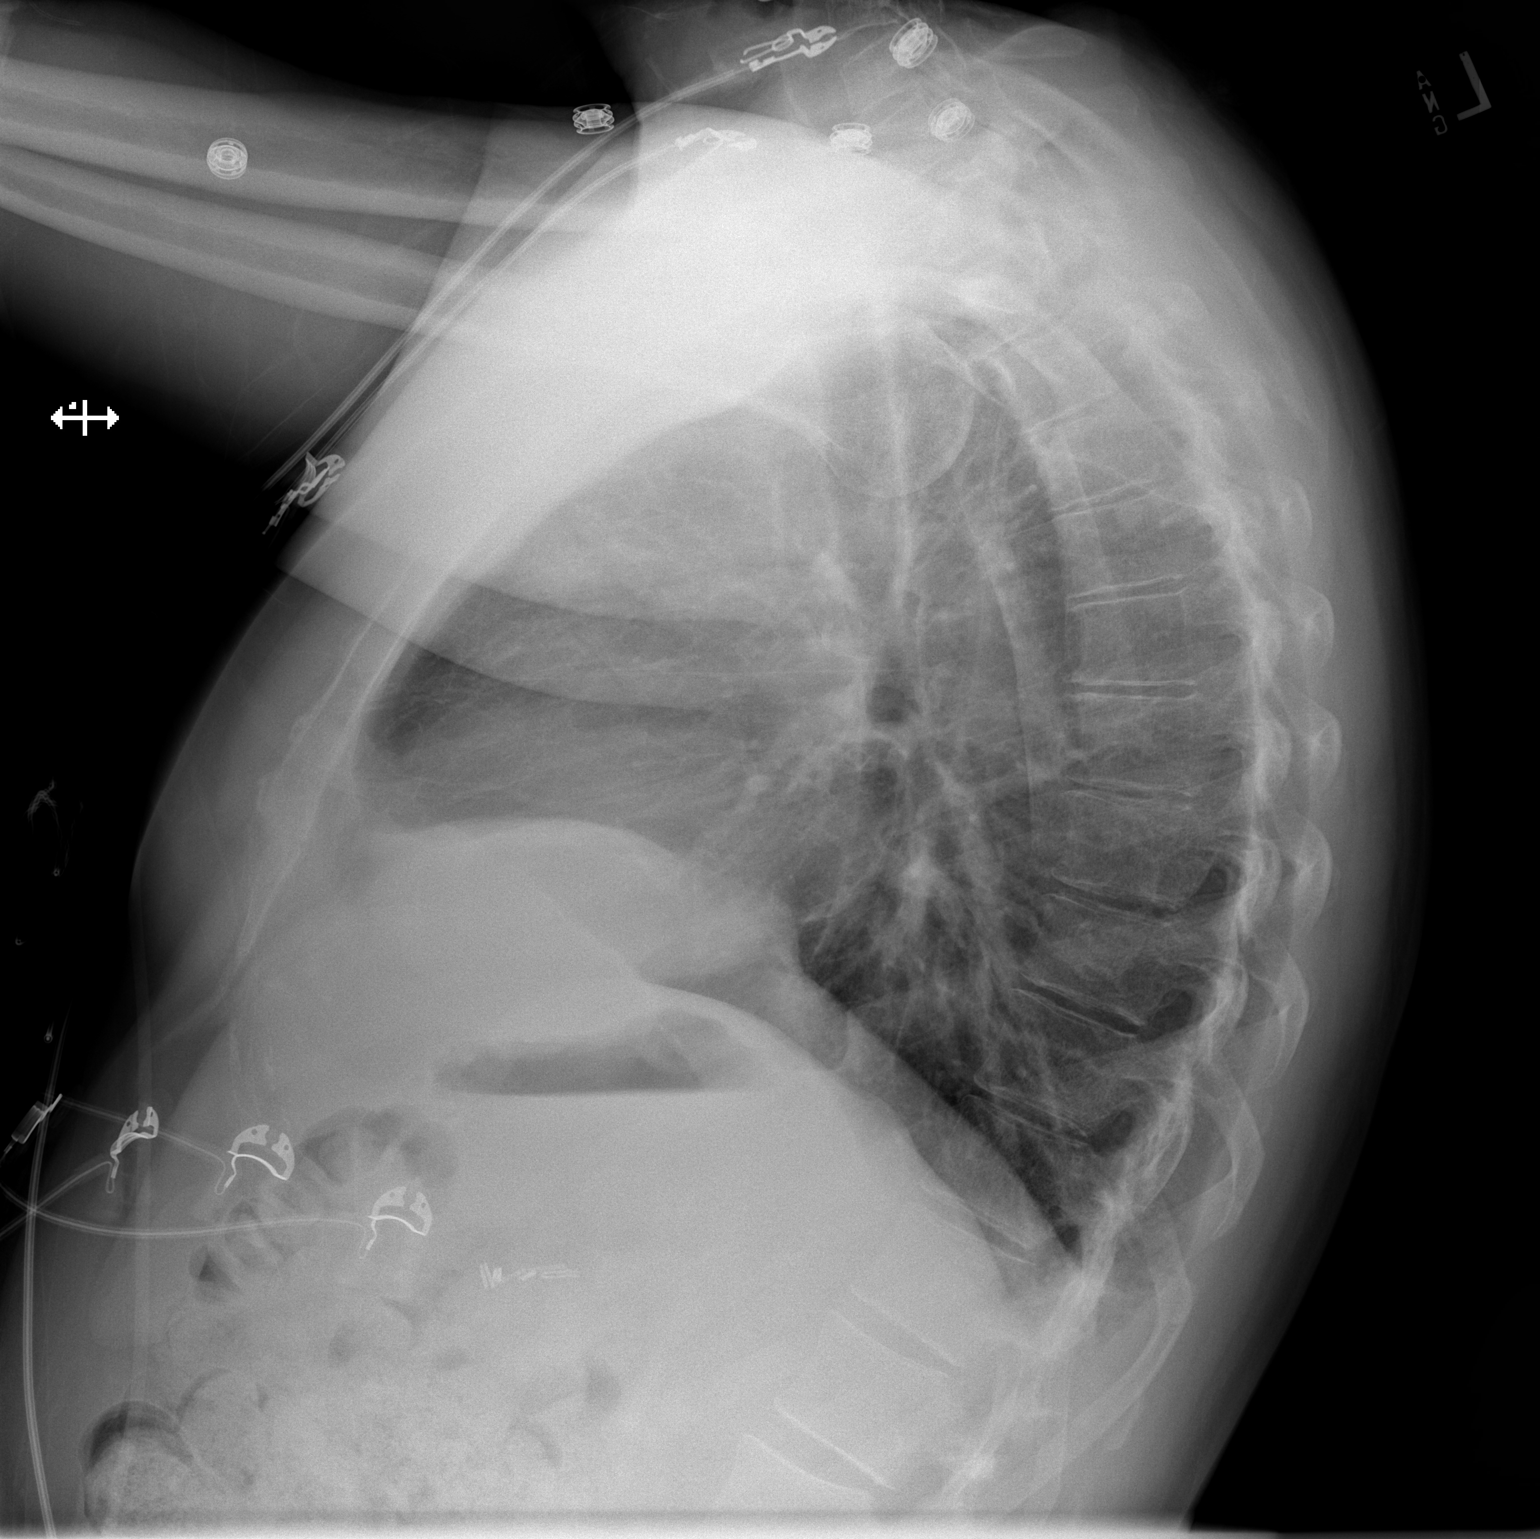

[2 of 2 positions shown; findings below may reference images not displayed]

FINDINGS: The cardiomediastinal silhouette is unremarkable.

Subsegmental bibasilar noted.

There is no evidence of focal airspace disease, pulmonary edema,
suspicious pulmonary nodule/mass, pleural effusion, or pneumothorax.

No acute bony abnormalities are identified.
IMPRESSION: Subsegmental bibasilar atelectasis.

## 2023-01-14 ENCOUNTER — Encounter (HOSPITAL_COMMUNITY): Payer: Self-pay | Admitting: Registered Nurse

## 2023-01-14 ENCOUNTER — Ambulatory Visit (HOSPITAL_COMMUNITY)
Admission: EM | Admit: 2023-01-14 | Discharge: 2023-01-17 | Disposition: A | Discharge status: Other Acute Inpatient Hospital | Payer: MEDICAID | Attending: Behavioral Health | Admitting: Behavioral Health

## 2023-01-14 DIAGNOSIS — F10930 Alcohol use, unspecified with withdrawal, uncomplicated: Secondary | ICD-10-CM

## 2023-01-14 DIAGNOSIS — F10939 Alcohol use, unspecified with withdrawal, unspecified: Secondary | ICD-10-CM | POA: Diagnosis present

## 2023-01-14 DIAGNOSIS — F1010 Alcohol abuse, uncomplicated: Secondary | ICD-10-CM

## 2023-01-14 DIAGNOSIS — F1020 Alcohol dependence, uncomplicated: Secondary | ICD-10-CM

## 2023-01-14 DIAGNOSIS — F32A Depression, unspecified: Secondary | ICD-10-CM | POA: Diagnosis not present

## 2023-01-14 DIAGNOSIS — Z87891 Personal history of nicotine dependence: Secondary | ICD-10-CM | POA: Insufficient documentation

## 2023-01-14 DIAGNOSIS — F10230 Alcohol dependence with withdrawal, uncomplicated: Secondary | ICD-10-CM | POA: Insufficient documentation

## 2023-01-14 DIAGNOSIS — F419 Anxiety disorder, unspecified: Secondary | ICD-10-CM | POA: Insufficient documentation

## 2023-01-14 DIAGNOSIS — Z79899 Other long term (current) drug therapy: Secondary | ICD-10-CM | POA: Insufficient documentation

## 2023-01-14 LAB — LIPID PANEL
Cholesterol: 288 mg/dL — ABNORMAL HIGH (ref 0–200)
HDL: 84 mg/dL (ref >40)
LDL Cholesterol: 170 mg/dL — ABNORMAL HIGH (ref 0–99)
Total CHOL/HDL Ratio: 3.4 RATIO
Triglycerides: 169 mg/dL — ABNORMAL HIGH (ref <150)
VLDL: 34 mg/dL (ref 0–40)

## 2023-01-14 LAB — POCT URINE DRUG SCREEN - MANUAL ENTRY (I-SCREEN)
POC Amphetamine UR: NOT DETECTED
POC Buprenorphine (BUP): POSITIVE — AB
POC Cocaine UR: NOT DETECTED
POC Marijuana UR: NOT DETECTED
POC Methadone UR: NOT DETECTED
POC Methamphetamine UR: NOT DETECTED
POC Morphine: NOT DETECTED
POC Oxazepam (BZO): NOT DETECTED
POC Oxycodone UR: NOT DETECTED
POC Secobarbital (BAR): NOT DETECTED

## 2023-01-14 LAB — HEMOGLOBIN A1C
Hgb A1c MFr Bld: 5.5 % (ref 4.8–5.6)
Mean Plasma Glucose: 111.15 mg/dL

## 2023-01-14 LAB — COMPREHENSIVE METABOLIC PANEL
ALT: 71 U/L — ABNORMAL HIGH (ref 0–44)
AST: 160 U/L — ABNORMAL HIGH (ref 15–41)
Albumin: 4 g/dL (ref 3.5–5.0)
Alkaline Phosphatase: 65 U/L (ref 38–126)
Anion gap: 18 — ABNORMAL HIGH (ref 5–15)
BUN: 6 mg/dL (ref 6–20)
CO2: 27 mmol/L (ref 22–32)
Calcium: 9.6 mg/dL (ref 8.9–10.3)
Chloride: 95 mmol/L — ABNORMAL LOW (ref 98–111)
Creatinine, Ser: 0.71 mg/dL (ref 0.61–1.24)
GFR, Estimated: >60 mL/min (ref >60)
Glucose, Bld: 88 mg/dL (ref 70–99)
Potassium: 4 mmol/L (ref 3.5–5.1)
Sodium: 140 mmol/L (ref 135–145)
Total Bilirubin: 0.7 mg/dL (ref 0.3–1.2)
Total Protein: 6.7 g/dL (ref 6.5–8.1)

## 2023-01-14 LAB — URINALYSIS, ROUTINE W REFLEX MICROSCOPIC
Bilirubin Urine: NEGATIVE
Glucose, UA: NEGATIVE mg/dL
Hgb urine dipstick: NEGATIVE
Ketones, ur: NEGATIVE mg/dL
Leukocytes,Ua: NEGATIVE
Nitrite: NEGATIVE
Protein, ur: NEGATIVE mg/dL
Specific Gravity, Urine: 1.009 (ref 1.005–1.030)
pH: 8 (ref 5.0–8.0)

## 2023-01-14 LAB — TSH: TSH: 2.594 u[IU]/mL (ref 0.350–4.500)

## 2023-01-14 LAB — CBC WITH DIFFERENTIAL/PLATELET
Abs Immature Granulocytes: 0.03 10*3/uL (ref 0.00–0.07)
Basophils Absolute: 0.1 10*3/uL (ref 0.0–0.1)
Basophils Relative: 1 %
Eosinophils Absolute: 0 10*3/uL (ref 0.0–0.5)
Eosinophils Relative: 1 %
HCT: 49.4 % (ref 39.0–52.0)
Hemoglobin: 16.8 g/dL (ref 13.0–17.0)
Immature Granulocytes: 1 %
Lymphocytes Relative: 20 %
Lymphs Abs: 1.2 10*3/uL (ref 0.7–4.0)
MCH: 31.8 pg (ref 26.0–34.0)
MCHC: 34 g/dL (ref 30.0–36.0)
MCV: 93.4 fL (ref 80.0–100.0)
Monocytes Absolute: 0.6 10*3/uL (ref 0.1–1.0)
Monocytes Relative: 10 %
Neutro Abs: 4.3 10*3/uL (ref 1.7–7.7)
Neutrophils Relative %: 67 %
Platelets: 264 10*3/uL (ref 150–400)
RBC: 5.29 MIL/uL (ref 4.22–5.81)
RDW: 14.2 % (ref 11.5–15.5)
WBC: 6.3 10*3/uL (ref 4.0–10.5)
nRBC: 0 % (ref 0.0–0.2)

## 2023-01-14 LAB — MAGNESIUM: Magnesium: 1.9 mg/dL (ref 1.7–2.4)

## 2023-01-14 LAB — ETHANOL: Alcohol, Ethyl (B): 152 mg/dL — ABNORMAL HIGH (ref <10)

## 2023-01-14 MED ORDER — GABAPENTIN 300 MG PO CAPS
300.0000 mg | ORAL_CAPSULE | Freq: Three times a day (TID) | ORAL | Status: DC
  Administered 2023-01-14 – 2023-01-16 (×7): 300 mg via ORAL
  Filled 2023-01-14 (×7): qty 1

## 2023-01-14 MED ORDER — ALUM & MAG HYDROXIDE-SIMETH 200-200-20 MG/5ML PO SUSP: 30.0000 mL | ORAL | Status: DC | PRN

## 2023-01-14 MED ORDER — FOLIC ACID 1 MG PO TABS
1.0000 mg | ORAL_TABLET | Freq: Every day | ORAL | Status: DC
  Administered 2023-01-14 – 2023-01-16 (×3): 1 mg via ORAL
  Filled 2023-01-14 (×3): qty 1

## 2023-01-14 MED ORDER — ALBUTEROL SULFATE HFA 108 (90 BASE) MCG/ACT IN AERS: 2.0000 | INHALATION_SPRAY | Freq: Four times a day (QID) | RESPIRATORY_TRACT | Status: DC | PRN

## 2023-01-14 MED ORDER — THIAMINE HCL 100 MG/ML IJ SOLN
100.0000 mg | Freq: Once | INTRAMUSCULAR | Status: AC
  Administered 2023-01-14: 100 mg via INTRAMUSCULAR
  Filled 2023-01-14: qty 2

## 2023-01-14 MED ORDER — TRAZODONE HCL 50 MG PO TABS
50.0000 mg | ORAL_TABLET | Freq: Every evening | ORAL | Status: DC | PRN
  Administered 2023-01-14 – 2023-01-15 (×2): 50 mg via ORAL
  Filled 2023-01-14 (×2): qty 1

## 2023-01-14 MED ORDER — LORAZEPAM 1 MG PO TABS
1.0000 mg | ORAL_TABLET | Freq: Four times a day (QID) | ORAL | Status: AC
  Administered 2023-01-14 (×3): 1 mg via ORAL
  Filled 2023-01-14 (×3): qty 1

## 2023-01-14 MED ORDER — BUPRENORPHINE HCL-NALOXONE HCL 8-2 MG SL SUBL
1.0000 | SUBLINGUAL_TABLET | Freq: Two times a day (BID) | SUBLINGUAL | Status: DC
  Filled 2023-01-14: qty 1

## 2023-01-14 MED ORDER — ADULT MULTIVITAMIN W/MINERALS CH
1.0000 | ORAL_TABLET | Freq: Every day | ORAL | Status: DC
  Administered 2023-01-14 – 2023-01-16 (×3): 1 via ORAL
  Filled 2023-01-14 (×3): qty 1

## 2023-01-14 MED ORDER — LORAZEPAM 1 MG PO TABS
1.0000 mg | ORAL_TABLET | Freq: Four times a day (QID) | ORAL | Status: DC | PRN
  Administered 2023-01-14 – 2023-01-17 (×5): 1 mg via ORAL
  Filled 2023-01-14 (×5): qty 1

## 2023-01-14 MED ORDER — ACETAMINOPHEN 325 MG PO TABS: 650.0000 mg | ORAL_TABLET | Freq: Four times a day (QID) | ORAL | Status: DC | PRN

## 2023-01-14 MED ORDER — BUPROPION HCL ER (XL) 300 MG PO TB24
300.0000 mg | ORAL_TABLET | Freq: Every morning | ORAL | Status: DC
  Administered 2023-01-15 – 2023-01-16 (×2): 300 mg via ORAL
  Filled 2023-01-14 (×2): qty 1

## 2023-01-14 MED ORDER — ESCITALOPRAM OXALATE 10 MG PO TABS
10.0000 mg | ORAL_TABLET | Freq: Every day | ORAL | Status: DC
  Administered 2023-01-15 – 2023-01-16 (×2): 10 mg via ORAL
  Filled 2023-01-14 (×2): qty 1

## 2023-01-14 MED ORDER — ONDANSETRON 4 MG PO TBDP: 4.0000 mg | ORAL_TABLET | Freq: Four times a day (QID) | ORAL | Status: DC | PRN

## 2023-01-14 MED ORDER — THIAMINE MONONITRATE 100 MG PO TABS
100.0000 mg | ORAL_TABLET | Freq: Every day | ORAL | Status: DC
  Administered 2023-01-15 – 2023-01-16 (×2): 100 mg via ORAL
  Filled 2023-01-14 (×2): qty 1

## 2023-01-14 MED ORDER — HYDROXYZINE HCL 25 MG PO TABS
25.0000 mg | ORAL_TABLET | Freq: Four times a day (QID) | ORAL | Status: DC | PRN
  Administered 2023-01-14 – 2023-01-16 (×5): 25 mg via ORAL
  Filled 2023-01-14 (×5): qty 1

## 2023-01-14 MED ORDER — LORAZEPAM 1 MG PO TABS
2.0000 mg | ORAL_TABLET | Freq: Once | ORAL | Status: AC
  Administered 2023-01-14: 2 mg via ORAL
  Filled 2023-01-14: qty 2

## 2023-01-14 MED ORDER — GABAPENTIN 300 MG PO CAPS
300.0000 mg | ORAL_CAPSULE | Freq: Three times a day (TID) | ORAL | Status: DC
  Filled 2023-01-14: qty 1

## 2023-01-14 MED ORDER — LORAZEPAM 1 MG PO TABS
1.0000 mg | ORAL_TABLET | Freq: Two times a day (BID) | ORAL | Status: AC
  Administered 2023-01-16 (×2): 1 mg via ORAL
  Filled 2023-01-14 (×2): qty 1

## 2023-01-14 MED ORDER — PANTOPRAZOLE SODIUM 40 MG PO TBEC
40.0000 mg | DELAYED_RELEASE_TABLET | Freq: Every day | ORAL | Status: DC
  Administered 2023-01-14 – 2023-01-16 (×3): 40 mg via ORAL
  Filled 2023-01-14 (×3): qty 1

## 2023-01-14 MED ORDER — LORAZEPAM 1 MG PO TABS
1.0000 mg | ORAL_TABLET | Freq: Three times a day (TID) | ORAL | Status: AC
  Administered 2023-01-15 (×3): 1 mg via ORAL
  Filled 2023-01-14 (×3): qty 1

## 2023-01-14 MED ORDER — FLUTICASONE PROPIONATE HFA 110 MCG/ACT IN AERO: 2.0000 | INHALATION_SPRAY | Freq: Two times a day (BID) | RESPIRATORY_TRACT | Status: DC | PRN

## 2023-01-14 MED ORDER — MAGNESIUM HYDROXIDE 400 MG/5ML PO SUSP: 30.0000 mL | Freq: Every day | ORAL | Status: DC | PRN

## 2023-01-14 MED ORDER — BUPRENORPHINE HCL-NALOXONE HCL 8-2 MG SL SUBL
1.0000 | SUBLINGUAL_TABLET | Freq: Two times a day (BID) | SUBLINGUAL | Status: DC
  Administered 2023-01-14 – 2023-01-16 (×4): 1 via SUBLINGUAL
  Filled 2023-01-14 (×4): qty 1

## 2023-01-14 MED ORDER — LOPERAMIDE HCL 2 MG PO CAPS: 2.0000 mg | ORAL_CAPSULE | ORAL | Status: DC | PRN

## 2023-01-14 MED ORDER — LORAZEPAM 1 MG PO TABS: 1.0000 mg | ORAL_TABLET | Freq: Every day | ORAL | Status: DC

## 2023-01-14 NOTE — Progress Notes (Signed)
   01/14/23 1109  [facility] Triage Screening (Walk-ins at [facility] only)  How Did You Hear About [place]? Family/Friend  What Is the Reason for Your Visit/Call Today? Pt presents to [facility] voluntarily accompanied by his mom seeking alcohol detox. Pt reports he has been struggling with alcoholism for several years. Pt reports consuming 2 shots of vodka prior to coming to this facility about 20 minutes ago. Pt reports he drinks daily and he can consume up to a gallon of liquor between 3 days. Pt states he is currently prescribed Suboxone and he has been taking this for 1.5 years and states he is not willing to go into treatment if he cannot continue to take this medication. Pt reports hx of DT's, he reports he starts to have withdrawal symptoms after about 5 hours of not drinking. Pt denies past hx of seizures when detoxing. Pt reports he went to a drug program 5-6 years ago while in prison. Pt is currently on probation. Pt currently lives with his parents. Pt reports difficulty breathing at this time, but denies any other symptoms currently. Pt denies SI/HI and AVH.  How Long Has This Been Causing You Problems? > than 6 months  Have You Recently Had Any Thoughts About Hurting Yourself? No  Are You Planning to Commit Suicide/Harm Yourself At This time? No  Have you Recently Had Thoughts About Hurting Someone Karolee Ohs? No  Are You Planning To Harm Someone At This Time? No  Are you currently experiencing any auditory, visual or other hallucinations? No  Have You Used Any Alcohol or Drugs in the Past 24 Hours? Yes  How long ago did you use Drugs or Alcohol? 20 minutes ago  What Did You Use and How Much? 2 shots of vodka prior to coming to this facility about 20 minutes ago  Do you have any current medical co-morbidities that require immediate attention? No  Clinician description of patient physical appearance/behavior: appropriately dressed, calm ,cooperative  What Do You Feel Would Help You the Most Today? Alcohol or Drug  Use Treatment  If access to [facility] was not available, would you have sought care in the Emergency Department? No  Determination of Need Urgent (48 hours)  Options For Referral Facility-Based Crisis;ED Visit

## 2023-01-14 NOTE — ED Notes (Signed)
Patient A&Ox4. Pt presents to [facility] voluntarily seeking alcohol detox. Patient denies SI/HI and AVH.  Patient denies any physical complaints when asked. No acute distress noted. Support and encouragement provided. Routine safety checks conducted according to facility protocol. Encouraged patient to notify staff if thoughts of harm toward self or others arise. Patient verbalize understanding and agreement. Will continue to monitor for safety.

## 2023-01-14 NOTE — BH Assessment (Signed)
Comprehensive Clinical Assessment (CCA) Note  01/14/2023 Eric Nolan [id number]  DISPOSIITON: Per Assunta Found NP pt is recommended for observation in [facility] OBS unit.   The patient demonstrates the following risk factors for suicide: Chronic risk factors for suicide include: substance use disorder. Acute risk factors for suicide include: N/A. Protective factors for this patient include: positive social support, responsibility to others (children, family), and hope for the future. Considering these factors, the overall suicide risk at this point appears to be low. Patient is appropriate for outpatient follow up.   Per Triage assessment: "Pt presents to [facility] voluntarily accompanied by his mom seeking alcohol detox. Pt reports he has been struggling with alcoholism for several years. Pt reports consuming 2 shots of vodka prior to coming to this facility about 20 minutes ago. Pt reports he drinks daily and he can consume up to a gallon of liquor between 3 days. Pt states he is currently prescribed Suboxone and he has been taking this for 1.5 years and states he is not willing to go into treatment if he cannot continue to take this medication. Pt reports hx of DT's, he reports he starts to have withdrawal symptoms after about 5 hours of not drinking. Pt denies past hx of seizures when detoxing. Pt reports he went to a drug program 5-6 years ago while in prison. Pt is currently on probation. Pt currently lives with his parents. Pt reports difficulty breathing at this time, but denies any other symptoms currently. Pt denies SI/HI and AVH."  With further assessment: Pt stated that he has just completed his college degree in computers. Pt stated he lives with his parents and sometimes stays at his girlfriend's home. 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 Pt stated that he has charged pending against him that are about to be dismissed and has a hx of  incarceration. Pt stated he went through a drug program while incarcerated. Pt estimated this was about 5 years ago. Pt stated that he has trouble sleeping calmly or feeling rested when he wakes up. 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   Pt stated that he currently is starting to experience withdrawal symptoms from alcohol including tremors, diarrhea, nausea/vomiting, excessive sweating and possibly once some hallucinations. Pt stated that his consumption of alcohol is ruining his health. He stated that he has trouble eating and has lost about 20 pounds in the last year without trying. Pt stated that he does not sleep well but does not generally get out of bed unless he is going to work. Pt stated that he is currently going through Suboxone treatment.    Chief Complaint:  Chief Complaint  Patient presents with   Alcohol Problem   Visit Diagnosis:  Alcohol Use d/o, Severe    CCA Screening, Triage and Referral (STR)  Patient Reported Information How did you hear about [place]? Family/Friend  What Is the Reason for Your Visit/Call Today? Pt presents to [facility] voluntarily accompanied by his mom seeking alcohol detox. Pt reports he has been struggling with alcoholism for several years. Pt reports consuming 2 shots of vodka prior to coming to this facility about 20 minutes ago. Pt reports he drinks daily and he can consume up to a gallon of liquor between 3 days. Pt states he is currently prescribed Suboxone and he has been taking this for 1.5 years and states he is not willing to go  into treatment if he cannot continue to take this medication. Pt reports hx of DT's, he reports he starts to have withdrawal symptoms after about 5 hours of not drinking. Pt denies past hx of seizures when detoxing. Pt reports he went to a drug program 5-6 years ago while in prison. Pt is currently on probation. Pt currently lives with his parents. Pt reports difficulty breathing at this time, but denies  any other symptoms currently. Pt denies SI/HI and AVH.  How Long Has This Been Causing You Problems? > than 6 months  What Do You Feel Would Help You the Most Today? Alcohol or Drug Use Treatment   Have You Recently Had Any Thoughts About Hurting Yourself? No  Are You Planning to Commit Suicide/Harm Yourself At This time? No   Flowsheet Row ED from 01/14/2023 in [facility] ED from 11/17/2021 in [facility] Emergency Department at [facility] ED to Hosp-Admission (Discharged) from 04/29/2021 in [facility] LONG 4TH FLOOR PROGRESSIVE CARE AND UROLOGY  C-SSRS RISK CATEGORY No Risk No Risk No Risk       Have you Recently Had Thoughts About Hurting Someone Karolee Ohs? No  Are You Planning to Harm Someone at This Time? No  Explanation: na  Have You Used Any Alcohol or Drugs in the Past 24 Hours? Yes  What Did You Use and How Much? 2 shots of vodka prior to coming to this facility about 20 minutes ago   Do You Currently Have a Therapist/Psychiatrist? No  Name of Therapist/Psychiatrist: Name of Therapist/Psychiatrist: na   Have You Been Recently Discharged From Any Office Practice or Programs? No  Explanation of Discharge From Practice/Program: na     CCA Screening Triage Referral Assessment Type of Contact: Face-to-Face  Telemedicine Service Delivery:   Is this Initial or Reassessment?   Date Telepsych consult ordered in [facility]:    Time Telepsych consult ordered in [facility]:    Location of Assessment: [facility] Assessment Services  Provider Location: [facility] Assessment Services   Collateral Involvement: none   Does Patient Have a Automotive engineer Guardian? No  Legal Guardian Contact Information: na  Copy of Legal Guardianship Form: No - copy requested  Legal Guardian Notified of Arrival: -- (na)  Legal Guardian Notified of Pending Discharge: -- (na)  If Minor and Not Living with Parent(s), Who has Custody? adult  Is CPS involved or ever  been involved? -- (none reported)  Is APS involved or ever been involved? -- (none reported)   Patient Determined To Be At Risk for Harm To Self or Others Based on Review of Patient Reported Information or Presenting Complaint? No  Method: No Plan  Availability of Means: No access or NA  Intent: Vague intent or NA  Notification Required: No need or identified person  Additional Information for Danger to Others Potential: na Additional Comments for Danger to Others Potential: na  Are There Guns or Other Weapons in Your Home? No (denied)  Types of Guns/Weapons: na  Are These Weapons Safely Secured?                            -- (na)  Who Could Verify You Are Able To Have These Secured: na  Do You Have any Outstanding Charges, Pending Court Dates, Parole/Probation? yes, pending charges of unidentified reasons; also, on probation; Hx of prison about 5 years ago  Contacted To Inform of Risk of Harm To Self  or Others: -- (na)    Does Patient Present under Involuntary Commitment? No    [place] of Residence: [place]   Patient Currently Receiving the Following Services: Not Receiving Services   Determination of Need: Urgent (48 hours)   Options For Referral: Facility-Based Crisis     CCA Biopsychosocial Patient Reported Schizophrenia/Schizoaffective Diagnosis in Past: No   Strengths: asking for help   Mental Health Symptoms Depression:   Difficulty Concentrating; Fatigue; Hopelessness; Increase/decrease in appetite; Sleep (too much or little); Weight gain/loss; Worthlessness   Duration of Depressive symptoms:  Duration of Depressive Symptoms: Greater than two weeks   Mania:   None   Anxiety:    Worrying   Psychosis:   None   Duration of Psychotic symptoms:    Trauma:   Avoids reminders of event; Difficulty staying/falling asleep; Hypervigilance; Re-experience of traumatic event (PTSD from experiences in prison)   Obsessions:   None   Compulsions:    None   Inattention:   N/A   Hyperactivity/Impulsivity:   N/A   Oppositional/Defiant Behaviors:   N/A   Emotional Irregularity:   None (none reported)   Other Mood/Personality Symptoms:   none    Mental Status Exam Appearance and self-care  Stature:   Average   Weight:   Average weight   Clothing:  casual  Grooming:   Neglected   Cosmetic use:   None   Posture/gait:   Normal   Motor activity:   Tremor   Sensorium  Attention:   Normal   Concentration:   Normal   Orientation:   X5   Recall/memory:   Normal   Affect and Mood  Affect:   Flat   Mood:   Depressed; Dysphoric; Anxious   Relating  Eye contact:   Normal   Facial expression:   Constricted   Attitude toward examiner:   Cooperative   Thought and Language  Speech flow:  Clear and Coherent   Thought content:   Appropriate to Mood and Circumstances   Preoccupation:   None   Hallucinations:   None   Organization:   Intact   Company secretary of Knowledge:   Average   Intelligence:   Average   Abstraction:   Functional   Judgement:   Fair   Dance movement psychotherapist:   Adequate   Insight:   Flashes of insight; Gaps   Decision Making:   Vacilates   Social Functioning  Social Maturity:   Impulsive   Social Judgement:   Heedless   Stress  Stressors:   -- (just graduated college; living with parents)   Coping Ability:   Exhausted; Overwhelmed   Skill Deficits:   Self-control   Supports:   Friends/Service system; Family     Religion: Religion/Spirituality Are You A Religious Person?: No How Might This Affect Treatment?: na  Leisure/Recreation: Leisure / Recreation Do You Have Hobbies?: No  Exercise/Diet: Exercise/Diet Do You Exercise?: No Have You Gained or Lost A Significant Amount of Weight in the Past Six Months?: Yes-Lost Number of Pounds Lost?: 20 (in 1 year) Do You Follow a Special Diet?: No Do You Have Any Trouble  Sleeping?: Yes Explanation of Sleeping Difficulties: Pt stated that he has trouble sleeping calmly or feeling rested when he wakes up. 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 Patient's Job has Been Impacted by Current Illness: No Describe how Patient's Job has Been Impacted: na Has Patient ever Been in the Military?: No  Education: Education Is Patient Currently Attending School?: No Last Grade Completed: 16 Did You Attend College?: Yes What Type of College Degree Do you Have?: computer degree Did You Have An Individualized Education Program (IIEP): No Did You Have Any Difficulty At School?: No Patient's Education Has Been Impacted by Current Illness: No   CCA Family/Childhood History Family and Relationship History: Family history Marital status: Long term relationship Long term relationship, how long?: unknown What types of issues is patient dealing with in the relationship?: unknown Additional relationship information: sometimes lives with girllfriend Does patient have children?: No  Childhood History:  Childhood History By whom was/is the patient raised?: Both parents Did patient suffer any verbal/emotional/physical/sexual abuse as a child?: No Did patient suffer from severe childhood neglect?: No Has patient ever been sexually abused/assaulted/raped as an adolescent or adult?: No Was the patient ever a victim of a crime or a disaster?: No Witnessed domestic violence?: No Has patient been affected by domestic violence as an adult?: No       CCA Substance Use Alcohol/Drug Use: Alcohol / Drug Use Pain Medications: see MAR Prescriptions: see MAR Over the Counter: see MAR History of alcohol / drug use?: Yes Longest period of  sobriety (when/how long): "decades" Negative Consequences of Use: Legal, Personal relationships Withdrawal Symptoms: Diarrhea, Irritability, Nausea / Vomiting, Patient aware of relationship between substance abuse and physical/medical complications, Sweats, Tremors, Weakness Substance #1 Name of Substance 1: alcohol 1 - Age of First Use: 38 1 - Amount (size/oz): varies 1 - Frequency: daily 1 - Duration: ongoing 1 - Last Use / Amount: 20 minutes prior to arrival 1 - Method of Aquiring: purchase 1- Route of Use: drink, oral Substance #2 Name of Substance 2: Suboxone (Treatment) 2 - Age of First Use: unknown 2 - Amount (size/oz): unknown 2 - Frequency: daily 2 - Duration: ongoing 2 - Last Use / Amount: yesterday 2 - Method of Aquiring: treatment 2 - Route of Substance Use: oral                     ASAM's:  Six Dimensions of Multidimensional Assessment  Dimension 1:  Acute Intoxication and/or Withdrawal Potential:      Dimension 2:  Biomedical Conditions and Complications:   Dimension 2:  Description of patient's biomedical conditions and  complications: none reported  Dimension 3:  Emotional, Behavioral, or Cognitive Conditions and Complications:  Dimension 3:  Description of emotional, behavioral, or cognitive conditions and complications: MDD and GAD  Dimension 4:  Readiness to Change:     Dimension 5:  Relapse, Continued use, or Continued Problem Potential:     Dimension 6:  Recovery/Living Environment:     ASAM Severity Score:    ASAM Recommended Level of Treatment: ASAM Recommended Level of Treatment: Level III Residential Treatment   Substance use Disorder (SUD) Substance Use Disorder (SUD)  Checklist Symptoms of Substance Use: Presence of craving or strong urge to use, Evidence of withdrawal (Comment), Continued use despite having a persistent/recurrent physical/psychological problem caused/exacerbated by use, Continued use despite persistent or recurrent social,  interpersonal problems, caused or exacerbated by use  Recommendations for Services/Supports/Treatments: Recommendations for Services/Supports/Treatments Recommendations For Services/Supports/Treatments: Facility Based Crisis, Detox  Discharge Disposition:    DSM5 Diagnoses: Patient Active Problem List   Diagnosis Date Noted  Drug overdose, accidental or unintentional, initial encounter 04/30/2021   Hypocalcemia 04/30/2021   Hyperglycemia 04/30/2021   Elevated AST (SGOT) 04/30/2021   Alcohol dependence (HCC) 04/30/2021   Aspiration into airway 04/30/2021   Depression      Referrals to Alternative Service(s): Referred to Alternative Service(s):   Place:   Date:   Time:    Referred to Alternative Service(s):   Place:   Date:   Time:    Referred to Alternative Service(s):   Place:   Date:   Time:    Referred to Alternative Service(s):   Place:   Date:   Time:     Kaysha Parsell T, Counselor

## 2023-01-14 NOTE — ED Notes (Signed)
Pt A&O x 4, no distress noted. Anxious & fidgety.  Monitoring for safety.

## 2023-01-14 NOTE — ED Provider Notes (Addendum)
[facility] Continuous Assessment Admission H&P  Date: 01/14/23 Patient Name: Eric Nolan MRN: [id number] Chief Complaint: detox  Diagnoses:  Final diagnoses:  Alcohol withdrawal syndrome without complication (HCC)  Alcohol abuse, daily use  Uncomplicated alcohol dependence (HCC)    HPI: Eric Nolan 42 y.o. male patient presented to [facility] as walk in accompanied by his mother requesting alcohol detox.    Eric Nolan 42 y.o., male patient seen face to face by this provider, consulted with Dr. Nelly Rout; and chart reviewed on 01/14/23.  On evaluation Eric Nolan reports a chronic history of alcohol abuse.  States that he is currently drinking a half gallon Vodka in 3 days.  He states when he is not drinking within 2-5 hours withdrawal symptoms start.  He states that it is time that he stops because the withdrawal symptoms have worsened.  Patient denies suicidal/self-harm/homicidal ideation, psychosis, and paranoia.  Patient denies history of seizures with withdrawal.  He reports that he has had one prior detox/rehab while in prison 5 or 6 years ago and remained sober for "a pretty good while."   Patient reports. During evaluation Eric Nolan is seated in exam room with no noted distress.  He is alert/oriented x 4, calm, cooperative, attentive, and responses were relevant and appropriate to assessment questions.  He spoke in a clear tone at moderate volume, and normal pace, with good eye contact.   He denies suicidal/self-harm/homicidal ideation, psychosis, and paranoia.  Objectively:  there is no evidence of psychosis/mania or delusional thinking.  He conversed coherently, with goal directed thoughts, and no distractibility, or pre-occupation.   Total Time spent with patient: 45 minutes  Musculoskeletal  Strength & Muscle Tone: within normal limits Gait & Station: normal Patient leans: N/A  Psychiatric Specialty Exam  Presentation General  Appearance:  Appropriate for Environment  Eye Contact: Good  Speech: Clear and Coherent; Normal Rate  Speech Volume: Normal  Handedness: Right   Mood and Affect  Mood: Anxious; Dysphoric  Affect: Appropriate; Congruent   Thought Process  Thought Processes: Coherent  Descriptions of Associations:Intact  Orientation:Full (Time, Place and Person)  Thought Content:Logical  Diagnosis of Schizophrenia or Schizoaffective disorder in past: No   Hallucinations:Hallucinations: None  Ideas of Reference:None  Suicidal Thoughts:Suicidal Thoughts: No  Homicidal Thoughts:Homicidal Thoughts: No   Sensorium  Memory: Immediate Good; Recent Good; Remote Good  Judgment:Poor Insight: Present   Executive Functions  Concentration: Good  Attention Span: Good  Recall: Good  Fund of Knowledge: Good  Language: Good   Psychomotor Activity  Psychomotor Activity: Psychomotor Activity: Normal   Assets  Assets: Communication Skills; Desire for Improvement; Housing; Resilience; Social Support   Sleep  Sleep: Sleep: Good   Nutritional Assessment (For OBS and [facility]BC admissions only) Has the patient had a weight loss or gain of 10 pounds or more in the last 3 months?: No Has the patient had a decrease in food intake/or appetite?: No Does the patient have dental problems?: No Does the patient have eating habits or behaviors that may be indicators of an eating disorder including binging or inducing vomiting?: No Has the patient recently lost weight without trying?: 0 Has the patient been eating poorly because of a decreased appetite?: 0 Malnutrition Screening Tool Score: 0    Physical Exam Vitals and nursing note reviewed. Chaperone present: Mother present.  Constitutional:      General: He is not in acute distress.    Appearance: Normal appearance. He is not  ill-appearing.  HENT:     Head: Normocephalic.  Eyes:     Conjunctiva/sclera: Conjunctivae  normal.  Cardiovascular:     Rate and Rhythm: Normal rate.  Pulmonary:     Effort: Pulmonary effort is normal.  Musculoskeletal:        General: Normal range of motion.     Cervical back: Normal range of motion.  Skin:    General: Skin is warm and dry.  Neurological:     Mental Status: He is alert and oriented to person, place, and time.  Psychiatric:        Attention and Perception: Attention and perception normal. He does not perceive auditory or visual hallucinations.        Mood and Affect: Affect normal. Mood is anxious and depressed.        Speech: Speech normal.        Behavior: Behavior normal. Behavior is cooperative.        Thought Content: Thought content normal. Thought content is not paranoid or delusional. Thought content does not include homicidal or suicidal ideation.        Cognition and Memory: Cognition normal.        Judgment: Judgment normal.    Review of Systems  Constitutional:        No other complaints voiced   Neurological:  Seizures: Denies history of seizures.  Psychiatric/Behavioral:  Depression: Stable. Hallucinations: Denies. Substance abuse: Alcohol daily. Suicidal ideas: Denies. Nervous/anxious: Stable.   All other systems reviewed and are negative.   Blood pressure (!) 147/106, pulse (!) 112, temperature 98.4 F (36.9 C), temperature source Oral, resp. rate 16, SpO2 93%. There is no height or weight on file to calculate BMI.  Past Psychiatric History: depression, anxiety, alcohol abuse, dependence   Is the patient at risk to self? No  Has the patient been a risk to self in the past 6 months? No .    Has the patient been a risk to self within the distant past? No   Is the patient a risk to others? No   Has the patient been a risk to others in the past 6 months? No   Has the patient been a risk to others within the distant past? No   Past Medical History:  Past Medical History:  Diagnosis Date   Depression    Gallstones      Family  History:  Family History  Problem Relation Age of Onset   Valvular heart disease Father    Gout Father    Drug abuse Brother    Alzheimer's disease Maternal Grandmother    Heart attack Paternal Grandfather    Stroke Paternal Grandfather      Social History:  Social History   Tobacco Use   Smoking status: Former    Current packs/day: 0.00    Average packs/day: 1 pack/day for 5.2 years (5.2 ttl pk-yrs)    Types: Cigarettes    Start date: 04/21/2000    Quit date: 06/23/2005    Years since quitting: 17.5   Smokeless tobacco: Never  Vaping Use   Vaping status: Never Used  Substance Use Topics   Alcohol use: Yes    Alcohol/week: 70.0 standard drinks of alcohol    Types: 70 Standard drinks or equivalent per week    Comment: 5-10 drinks per day   Drug use: Yes     Last Labs:  Admission on 01/14/2023  Component Date Value Ref Range Status   WBC 01/14/2023 6.3  4.0 - 10.5 K/uL Final   RBC 01/14/2023 5.29  4.22 - 5.81 MIL/uL Final   Hemoglobin 01/14/2023 16.8  13.0 - 17.0 g/dL Final   HCT 27/25/3664 49.4  39.0 - 52.0 % Final   MCV 01/14/2023 93.4  80.0 - 100.0 fL Final   MCH 01/14/2023 31.8  26.0 - 34.0 pg Final   MCHC 01/14/2023 34.0  30.0 - 36.0 g/dL Final   RDW 40/34/7425 14.2  11.5 - 15.5 % Final   Platelets 01/14/2023 264  150 - 400 K/uL Final   nRBC 01/14/2023 0.0  0.0 - 0.2 % Final   Neutrophils Relative % 01/14/2023 67  % Final   Neutro Abs 01/14/2023 4.3  1.7 - 7.7 K/uL Final   Lymphocytes Relative 01/14/2023 20  % Final   Lymphs Abs 01/14/2023 1.2  0.7 - 4.0 K/uL Final   Monocytes Relative 01/14/2023 10  % Final   Monocytes Absolute 01/14/2023 0.6  0.1 - 1.0 K/uL Final   Eosinophils Relative 01/14/2023 1  % Final   Eosinophils Absolute 01/14/2023 0.0  0.0 - 0.5 K/uL Final   Basophils Relative 01/14/2023 1  % Final   Basophils Absolute 01/14/2023 0.1  0.0 - 0.1 K/uL Final   Immature Granulocytes 01/14/2023 1  % Final   Abs Immature Granulocytes 01/14/2023 0.03   0.00 - 0.07 K/uL Final   Performed at [facility], [address] [place], [place] [address]   Hgb A1c MFr Bld 01/14/2023 5.5  4.8 - 5.6 % Final   Comment: (NOTE) Pre diabetes:          5.7%-6.4%  Diabetes:              >6.4%  Glycemic control for   <7.0% adults with diabetes    Mean Plasma Glucose 01/14/2023 111.15  mg/dL Final   Performed at [facility], [address] [place], [place] [address]    Allergies: Patient has no known allergies.  Medications:  Facility Ordered Medications  Medication   acetaminophen (TYLENOL) tablet 650 mg   alum & mag hydroxide-simeth (MAALOX/MYLANTA) 200-200-20 MG/5ML suspension 30 mL   magnesium hydroxide (MILK OF MAGNESIA) suspension 30 mL   [COMPLETED] thiamine (VITAMIN B1) injection 100 mg   [START ON 01/15/2023] thiamine (VITAMIN B1) tablet 100 mg   multivitamin with minerals tablet 1 tablet   LORazepam (ATIVAN) tablet 1 mg   hydrOXYzine (ATARAX) tablet 25 mg   loperamide (IMODIUM) capsule 2-4 mg   ondansetron (ZOFRAN-ODT) disintegrating tablet 4 mg   LORazepam (ATIVAN) tablet 1 mg   Followed by   Melene Muller ON 01/15/2023] LORazepam (ATIVAN) tablet 1 mg   Followed by   Melene Muller ON 01/16/2023] LORazepam (ATIVAN) tablet 1 mg   Followed by   Melene Muller ON 01/17/2023] LORazepam (ATIVAN) tablet 1 mg   buprenorphine-naloxone (SUBOXONE) 8-2 mg per SL tablet 1 tablet   albuterol (VENTOLIN HFA) 108 (90 Base) MCG/ACT inhaler 2 puff   fluticasone (FLOVENT HFA) 110 MCG/ACT inhaler 2 puff   folic acid (FOLVITE) tablet 1 mg   pantoprazole (PROTONIX) EC tablet 40 mg   escitalopram (LEXAPRO) tablet 10 mg   buPROPion (WELLBUTRIN XL) 24 hr tablet 300 mg   PTA Medications  Medication Sig   buPROPion (WELLBUTRIN XL) 300 MG 24 hr tablet Take 300 mg by mouth every morning.   albuterol (VENTOLIN HFA) 108 (90 Base) MCG/ACT inhaler Inhale 2 puffs into the lungs every 6 (six) hours as needed for wheezing or shortness of breath.  omeprazole (PRILOSEC) 20  MG capsule Take 2 capsules (40 mg total) by mouth daily. (Patient taking differently: Take 20 mg by mouth 2 (two) times daily before a meal.)   tadalafil (CIALIS) 10 MG tablet Take 10 mg by mouth daily as needed for erectile dysfunction.   fluticasone (FLOVENT HFA) 110 MCG/ACT inhaler Inhale 2 puffs into the lungs 2 (two) times daily. (Patient taking differently: Inhale 2 puffs into the lungs 2 (two) times daily as needed (For shortness of breath).)     Medical Decision Making  Eric Nolan was admitted to [facility] continuous assessment unit  for Alcohol withdrawal syndrome [facility]), crisis management, and stabilization while awaiting appropriate bed. Routine labs ordered, which include Lab Orders         CBC with Differential/Platelet         Comprehensive metabolic panel         Hemoglobin A1c         Magnesium         Ethanol         Lipid panel         TSH         Prolactin         Urinalysis, Routine w reflex microscopic -Urine, Clean Catch         POCT Urine Drug Screen - (I-Screen)    Medication Management: Medications started Meds ordered this encounter  Medications   acetaminophen (TYLENOL) tablet 650 mg   alum & mag hydroxide-simeth (MAALOX/MYLANTA) 200-200-20 MG/5ML suspension 30 mL   magnesium hydroxide (MILK OF MAGNESIA) suspension 30 mL   thiamine (VITAMIN B1) injection 100 mg   thiamine (VITAMIN B1) tablet 100 mg   multivitamin with minerals tablet 1 tablet   LORazepam (ATIVAN) tablet 1 mg   hydrOXYzine (ATARAX) tablet 25 mg   loperamide (IMODIUM) capsule 2-4 mg   ondansetron (ZOFRAN-ODT) disintegrating tablet 4 mg   FOLLOWED BY Linked Order Group    LORazepam (ATIVAN) tablet 1 mg    LORazepam (ATIVAN) tablet 1 mg    LORazepam (ATIVAN) tablet 1 mg    LORazepam (ATIVAN) tablet 1 mg   buprenorphine-naloxone (SUBOXONE) 8-2 mg per SL tablet 1 tablet   albuterol (VENTOLIN HFA) 108 (90 Base) MCG/ACT inhaler 2 puff   fluticasone  (FLOVENT HFA) 110 MCG/ACT inhaler 2 puff   folic acid (FOLVITE) tablet 1 mg   pantoprazole (PROTONIX) EC tablet 40 mg   escitalopram (LEXAPRO) tablet 10 mg   buPROPion (WELLBUTRIN XL) 24 hr tablet 300 mg    Will maintain continuous observation for safety. Social work will consult with patient assisting in finding detox facility with available and appropriate bed.     Recommendations  Based on my evaluation the patient does not appear to have an emergency medical condition.  Shuvon Rankin, NP 01/14/23  1:44 PM

## 2023-01-14 NOTE — ED Notes (Signed)
Patient resting quietly in bed with eyes closed. Respirations equal and unlabored, skin warm and dry, NAD. Routine safety checks conducted according to facility protocol. Will continue to monitor for safety.  

## 2023-01-15 ENCOUNTER — Other Ambulatory Visit: Payer: Self-pay

## 2023-01-15 LAB — PROLACTIN: Prolactin: 24.6 ng/mL — ABNORMAL HIGH (ref 3.9–22.7)

## 2023-01-15 NOTE — ED Notes (Signed)
Pt sleeping at present, no distress noted.  Monitoring for safety. 

## 2023-01-15 NOTE — ED Notes (Signed)
Patient observed resting quietly, eyes closed. Respirations equal and unlabored. Will continue to monitor for safety.  

## 2023-01-15 NOTE — ED Notes (Signed)
Pt observed lying in bed with eyes closed. Respirations are equal and unlabored.Will continue to monitor for safety.

## 2023-01-15 NOTE — ED Notes (Addendum)
CIWA of 16. Medication given per protocol. Patient states on occasion, he thinks his dog is here. States he doesn't see him, and knows that he isn't here, but feels like he is at time. Provider made aware. Pt currently eating lunch. Will continue to monitor for safety.

## 2023-01-15 NOTE — Group Note (Deleted)
Group Topic: Balance in Life  Group Date: 01/15/2023 Start Time: 2100 End Time: 2130 Facilitators: Guss Bunde  Department: [facility]  Number of Participants: 1  Group Focus: acceptance Treatment Modality:  Skills Training Interventions utilized were confrontation Purpose: express feelings   Name: Branch Pacitti Date of Birth: 08/19/1980  MR: [id number]    Level of Participation: {THERAPIES; PSYCH GROUP PARTICIPATION UYQIH:47425} Quality of Participation: {THERAPIES; PSYCH QUALITY OF PARTICIPATION:23992} Interactions with others: {THERAPIES; PSYCH INTERACTIONS:23993} Mood/Affect: {THERAPIES; PSYCH MOOD/AFFECT:23994} Triggers (if applicable): *** Cognition: {THERAPIES; PSYCH COGNITION:23995} Progress: {THERAPIES; PSYCH PROGRESS:23997} Response: *** Plan: {THERAPIES; PSYCH ZDGL:87564}  Patients Problems:  Patient Active Problem List   Diagnosis Date Noted   Alcohol withdrawal syndrome (HCC) 01/14/2023   Alcohol abuse, daily use 01/14/2023   Drug overdose, accidental or unintentional, initial encounter 04/30/2021   Hypocalcemia 04/30/2021   Hyperglycemia 04/30/2021   Elevated AST (SGOT) 04/30/2021   Alcohol dependence (HCC) 04/30/2021   Aspiration into airway 04/30/2021   Depression
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
## 2023-01-15 NOTE — Care Management (Signed)
Care Management   [facility] - Per Aurea Graff, no open detox beds until next week.    [facility] in [place] - Per Intake worker, no open beds.  [facility] - Per Molly Maduro, they have open beds.  Writer faxed referral to the facility.   Writer informed the NP Rodell Perna working with the patient.

## 2023-01-15 NOTE — ED Notes (Signed)
Patient was provided lunch.

## 2023-01-15 NOTE — ED Notes (Signed)
Pt observed sitting in bed eating breakfast. He is calm and cooperative. Visible tremors. Per provider request, Ativan given @ [phone]. He denies SI/HI or AVH. Denies any additional needs at this moment. Will continue to monitor for safety.

## 2023-01-15 NOTE — Group Note (Deleted)
Group Topic: Balance in Life  Group Date: 01/14/2023 Start Time: 1600 End Time: 1630 Facilitators: Ronnell Freshwater, RN  Department: [facility]  Number of Participants: 5  Group Focus: anxiety Treatment Modality:  Spiritual Interventions utilized were story telling Purpose: increase insight   Name: Eric Nolan Date of Birth: 06-16-1981  MR: [id number]    Level of Participation: {THERAPIES; PSYCH GROUP PARTICIPATION [id number]:19147} Quality of Participation: {THERAPIES; PSYCH QUALITY OF PARTICIPATION:23992} Interactions with others: {THERAPIES; PSYCH INTERACTIONS:23993} Mood/Affect: {THERAPIES; PSYCH MOOD/AFFECT:23994} Triggers (if applicable): *** Cognition: {THERAPIES; PSYCH COGNITION:23995} Progress: {THERAPIES; PSYCH PROGRESS:23997} Response: *** Plan: {THERAPIES; PSYCH WGNF:62130}  Patients Problems:  Patient Active Problem List   Diagnosis Date Noted   Alcohol withdrawal syndrome (HCC) 01/14/2023   Alcohol abuse, daily use 01/14/2023   Drug overdose, accidental or unintentional, initial encounter 04/30/2021   Hypocalcemia 04/30/2021   Hyperglycemia 04/30/2021   Elevated AST (SGOT) 04/30/2021   Alcohol dependence (HCC) 04/30/2021   Aspiration into airway 04/30/2021   Depression

## 2023-01-15 NOTE — ED Notes (Signed)
Pt A&O x 4, no distress noted.  Remains tremulous, but states he feels better.  Denies SI, HI or AVH.  Monitoring for safety.

## 2023-01-16 MED ORDER — TRAZODONE HCL 50 MG PO TABS
50.0000 mg | ORAL_TABLET | ORAL | Status: AC
  Administered 2023-01-16: 50 mg via ORAL
  Filled 2023-01-16: qty 1

## 2023-01-16 MED ORDER — BUPRENORPHINE HCL-NALOXONE HCL 8-2 MG SL SUBL: 1.0000 | SUBLINGUAL_TABLET | Freq: Two times a day (BID) | SUBLINGUAL | Status: DC

## 2023-01-16 MED ORDER — TRAZODONE HCL 50 MG PO TABS
50.0000 mg | ORAL_TABLET | Freq: Every evening | ORAL | Status: DC | PRN
  Administered 2023-01-16: 50 mg via ORAL
  Filled 2023-01-16: qty 1

## 2023-01-16 MED ORDER — BUPRENORPHINE HCL-NALOXONE HCL 8-2 MG SL SUBL
1.0000 | SUBLINGUAL_TABLET | Freq: Two times a day (BID) | SUBLINGUAL | Status: DC
  Administered 2023-01-16: 1 via SUBLINGUAL
  Filled 2023-01-16: qty 1

## 2023-01-16 NOTE — ED Notes (Signed)
Patient is A&Ox4. Patient states he did not sleep too well last night but denies any extreme discomfort or pain. Patients CIWA score 6 this morning with reported body aches, anxiety, and mild tremors but states he does feel some improvement. No n/v. Denies any other withdrawal symptoms at this time. Patient compliant with medications and able to tolerate breakfast. Denies SI,HI, and A/V/H with no plan or intent. No s/s of current distress.

## 2023-01-16 NOTE — ED Notes (Signed)
Pt is awake laying in his bed calm and cooperative reading a book no pain or distress noted will continue to monitor for safety

## 2023-01-16 NOTE — Care Management (Signed)
Care Management   Writer referred patient to [facility], [facility] and [facility].

## 2023-01-16 NOTE — ED Notes (Signed)
Providers notified that patient takes suboxone q 12 hours - per PTA list  buprenorphine-naloxone (SUBOXONE) 8-2 mg SUBL SL tablet Place 1 tablet under the tongue 2 (two) times daily. Informant: Multiple Informants, Last Dose: 01/14/2023 Received from: 1 Outside Source, External Pharmacy   Awaiting orders

## 2023-01-16 NOTE — ED Provider Notes (Signed)
Behavioral Health Progress Note  Date and Time: 01/16/2023 10:10 AM Name: Eric Nolan MRN:  [id number]  Subjective:  Patient seen and evaluated face to face by this provider, chart reviewed and case discussed with Dr. Lucianne Muss. On evaluation, patient is alert and oriented x 4. His thought process is linear and speech is clear and coherent. His mood is dysphoric and affect is congruent. He is cooperative and does not appear to be in acute distress. He denies SI/HI/AVH. There is no objective evidence that the patient is responding to internal or external stimuli. He endorses depressive symptoms of decreased energy, decreased concentration and poor sleep. He reports sleeping consistently for 1.5 hours on and off throughout the night. He reports a fair appetite. He reports decreased alcohol withdrawal symptoms today. Today, he reports less body shakes, and reports having muscle spasms. CIWA 6 this morning at 9am. Vital signs are stable. He denies medication side effects. He requested to take his Suboxone earlier in the morning and evening time because he thinks it may be interfering with his ability to sleep at night. Agreeable, to modifying the Suboxone to 7 am and 7 pm. Will also add a repeat dose of Trazodone 50 mg po at bedtime prn for sleep. Patient continues to await bed availability for the [facility] to continue alcohol detox. Patient states that he is interested in long-term substance abuse treatment after he completes detox.   Diagnosis:  Final diagnoses:  Alcohol withdrawal syndrome without complication (HCC)  Alcohol abuse, daily use  Uncomplicated alcohol dependence (HCC)    Total Time spent with patient: 30 minutes  Past Psychiatric History: History of depression, anxiety, alcohol dependence, and opiate abuse. No outpatient psychiatry or therapy. Patient denies past inpatient psychiatric hospitalizations.    Past Medical History: No reported history. Patient denies history of HTN.     Family Psychiatric  History: History of mother alcoholism.    Social History: Patient resides with girlfriend. Patient employed as a Airline pilot. Patient denies using illicit drugs.   Additional Social History:    Pain Medications: see MAR Prescriptions: see MAR Over the Counter: see MAR History of alcohol / drug use?: Yes Longest period of sobriety (when/how long): "decades" Negative Consequences of Use: Legal, Personal relationships Withdrawal Symptoms: Diarrhea, Irritability, Nausea / Vomiting, Patient aware of relationship between substance abuse and physical/medical complications, Sweats, Tremors, Weakness Name of Substance 1: alcohol 1 - Age of First Use: 38 1 - Amount (size/oz): varies 1 - Frequency: daily 1 - Duration: ongoing 1 - Last Use / Amount: 20 minutes prior to arrival 1 - Method of Aquiring: purchase 1- Route of Use: drink, oral Name of Substance 2: Suboxone (Treatment) 2 - Age of First Use: unknown 2 - Amount (size/oz): unknown 2 - Frequency: daily 2 - Duration: ongoing 2 - Last Use / Amount: yesterday 2 - Method of Aquiring: treatment 2 - Route of Substance Use: oral      Current Medications:  Current Facility-Administered Medications  Medication Dose Route Frequency Provider Last Rate Last Admin   acetaminophen (TYLENOL) tablet 650 mg  650 mg Oral Q6H PRN Rankin, Shuvon B, NP       albuterol (VENTOLIN HFA) 108 (90 Base) MCG/ACT inhaler 2 puff  2 puff Inhalation Q6H PRN Rankin, Shuvon B, NP       alum & mag hydroxide-simeth (MAALOX/MYLANTA) 200-200-20 MG/5ML suspension 30 mL  30 mL Oral Q4H PRN Rankin, Shuvon B, NP       buprenorphine-naloxone (SUBOXONE) 8-2 mg per  SL tablet 1 tablet  1 tablet Sublingual BID Rankin, Shuvon B, NP   1 tablet at 01/16/23 0845   buPROPion (WELLBUTRIN XL) 24 hr tablet 300 mg  300 mg Oral q morning Rankin, Shuvon B, NP   300 mg at 01/16/23 0846   escitalopram (LEXAPRO) tablet 10 mg  10 mg Oral Daily Rankin, Shuvon B, NP   10 mg at  01/16/23 0845   fluticasone (FLOVENT HFA) 110 MCG/ACT inhaler 2 puff  2 puff Inhalation BID PRN Rankin, Shuvon B, NP       folic acid (FOLVITE) tablet 1 mg  1 mg Oral Daily Rankin, Shuvon B, NP   1 mg at 01/16/23 0845   gabapentin (NEURONTIN) capsule 300 mg  300 mg Oral TID Rankin, Shuvon B, NP   300 mg at 01/16/23 0845   hydrOXYzine (ATARAX) tablet 25 mg  25 mg Oral Q6H PRN Rankin, Shuvon B, NP   25 mg at 01/15/23 1810   loperamide (IMODIUM) capsule 2-4 mg  2-4 mg Oral PRN Rankin, Shuvon B, NP       LORazepam (ATIVAN) tablet 1 mg  1 mg Oral Q6H PRN Rankin, Shuvon B, NP   1 mg at 01/15/23 2138   LORazepam (ATIVAN) tablet 1 mg  1 mg Oral BID Rankin, Shuvon B, NP   1 mg at 01/16/23 0845   Followed by   Melene Muller ON 01/17/2023] LORazepam (ATIVAN) tablet 1 mg  1 mg Oral Daily Rankin, Shuvon B, NP       magnesium hydroxide (MILK OF MAGNESIA) suspension 30 mL  30 mL Oral Daily PRN Rankin, Shuvon B, NP       multivitamin with minerals tablet 1 tablet  1 tablet Oral Daily Rankin, Shuvon B, NP   1 tablet at 01/16/23 0844   ondansetron (ZOFRAN-ODT) disintegrating tablet 4 mg  4 mg Oral Q6H PRN Rankin, Shuvon B, NP       pantoprazole (PROTONIX) EC tablet 40 mg  40 mg Oral Daily Rankin, Shuvon B, NP   40 mg at 01/16/23 0844   thiamine (VITAMIN B1) tablet 100 mg  100 mg Oral Daily Rankin, Shuvon B, NP   100 mg at 01/16/23 0845   traZODone (DESYREL) tablet 50 mg  50 mg Oral QHS PRN Onuoha, Chinwendu V, NP   50 mg at 01/15/23 2137   Current Outpatient Medications  Medication Sig Dispense Refill   albuterol (VENTOLIN HFA) 108 (90 Base) MCG/ACT inhaler Inhale 2 puffs into the lungs every 6 (six) hours as needed for wheezing or shortness of breath. 8 g 2   buprenorphine-naloxone (SUBOXONE) 8-2 mg SUBL SL tablet Place 1 tablet under the tongue 2 (two) times daily.     buPROPion (WELLBUTRIN XL) 300 MG 24 hr tablet Take 300 mg by mouth every morning.     escitalopram (LEXAPRO) 10 MG tablet Take 10 mg by mouth daily.      folic acid (FOLVITE) 1 MG tablet Take 1 tablet by mouth daily.     omeprazole (PRILOSEC) 20 MG capsule Take 2 capsules (40 mg total) by mouth daily. (Patient taking differently: Take 20 mg by mouth 2 (two) times daily before a meal.) 60 capsule 0   tadalafil (CIALIS) 10 MG tablet Take 10 mg by mouth daily as needed for erectile dysfunction.     fluticasone (FLOVENT HFA) 110 MCG/ACT inhaler Inhale 2 puffs into the lungs 2 (two) times daily. (Patient taking differently: Inhale 2 puffs into the lungs 2 (two) times daily  as needed (For shortness of breath).) 1 each 12    Labs  Lab Results:  Admission on 01/14/2023  Component Date Value Ref Range Status   WBC 01/14/2023 6.3  4.0 - 10.5 K/uL Final   RBC 01/14/2023 5.29  4.22 - 5.81 MIL/uL Final   Hemoglobin 01/14/2023 16.8  13.0 - 17.0 g/dL Final   HCT 02/72/5366 49.4  39.0 - 52.0 % Final   MCV 01/14/2023 93.4  80.0 - 100.0 fL Final   MCH 01/14/2023 31.8  26.0 - 34.0 pg Final   MCHC 01/14/2023 34.0  30.0 - 36.0 g/dL Final   RDW 44/08/4740 14.2  11.5 - 15.5 % Final   Platelets 01/14/2023 264  150 - 400 K/uL Final   nRBC 01/14/2023 0.0  0.0 - 0.2 % Final   Neutrophils Relative % 01/14/2023 67  % Final   Neutro Abs 01/14/2023 4.3  1.7 - 7.7 K/uL Final   Lymphocytes Relative 01/14/2023 20  % Final   Lymphs Abs 01/14/2023 1.2  0.7 - 4.0 K/uL Final   Monocytes Relative 01/14/2023 10  % Final   Monocytes Absolute 01/14/2023 0.6  0.1 - 1.0 K/uL Final   Eosinophils Relative 01/14/2023 1  % Final   Eosinophils Absolute 01/14/2023 0.0  0.0 - 0.5 K/uL Final   Basophils Relative 01/14/2023 1  % Final   Basophils Absolute 01/14/2023 0.1  0.0 - 0.1 K/uL Final   Immature Granulocytes 01/14/2023 1  % Final   Abs Immature Granulocytes 01/14/2023 0.03  0.00 - 0.07 K/uL Final   Performed at [facility], [address], [place], [place] [address]   Hgb A1c MFr Bld 01/14/2023 5.5  4.8 - 5.6 % Final   Comment: (NOTE) Pre diabetes:           5.7%-6.4%  Diabetes:              >6.4%  Glycemic control for   <7.0% adults with diabetes    Mean Plasma Glucose 01/14/2023 111.15  mg/dL Final   Performed at [facility], [address], [place], [place] [address]   Alcohol, Ethyl (B) 01/14/2023 152 (H)  <10 mg/dL Final   Comment: (NOTE) Lowest detectable limit for serum alcohol is 10 mg/dL.  For medical purposes only. Performed at [facility], [address], [place], [place] [address]    TSH 01/14/2023 2.594  0.350 - 4.500 uIU/mL Final   Comment: Performed by a 3rd Generation assay with a functional sensitivity of <=0.01 uIU/mL. Performed at [facility], [address] [place], [place] [address]    Prolactin 01/14/2023 24.6 (H)  3.9 - 22.7 ng/mL Final   Comment: (NOTE) Performed At: [facility] [address] [place], [place] [id number] Jolene Schimke MD FU:[id number]    Color, Urine 01/14/2023 YELLOW  YELLOW Final   APPearance 01/14/2023 CLEAR  CLEAR Final   Specific Gravity, Urine 01/14/2023 1.009  1.005 - 1.030 Final   pH 01/14/2023 8.0  5.0 - 8.0 Final   Glucose, UA 01/14/2023 NEGATIVE  NEGATIVE mg/dL Final   Hgb urine dipstick 01/14/2023 NEGATIVE  NEGATIVE Final   Bilirubin Urine 01/14/2023 NEGATIVE  NEGATIVE Final   Ketones, ur 01/14/2023 NEGATIVE  NEGATIVE mg/dL Final   Protein, ur 02/54/2706 NEGATIVE  NEGATIVE mg/dL Final   Nitrite 23/76/2831 NEGATIVE  NEGATIVE Final   Leukocytes,Ua 01/14/2023 NEGATIVE  NEGATIVE Final   Performed at [facility], [address], [place], [place] [address]  POC Amphetamine UR 01/14/2023 None Detected  NONE DETECTED (Cut Off Level 1000 ng/mL) Final   POC Secobarbital (BAR) 01/14/2023 None Detected  NONE DETECTED (Cut Off Level 300 ng/mL) Final   POC Buprenorphine (BUP) 01/14/2023 Positive (A)  NONE DETECTED (Cut Off Level 10 ng/mL) Final   POC Oxazepam (BZO) 01/14/2023 None Detected  NONE DETECTED (Cut Off Level 300 ng/mL) Final   POC Cocaine  UR 01/14/2023 None Detected  NONE DETECTED (Cut Off Level 300 ng/mL) Final   POC Methamphetamine UR 01/14/2023 None Detected  NONE DETECTED (Cut Off Level 1000 ng/mL) Final   POC Morphine 01/14/2023 None Detected  NONE DETECTED (Cut Off Level 300 ng/mL) Final   POC Methadone UR 01/14/2023 None Detected  NONE DETECTED (Cut Off Level 300 ng/mL) Final   POC Oxycodone UR 01/14/2023 None Detected  NONE DETECTED (Cut Off Level 100 ng/mL) Final   POC Marijuana UR 01/14/2023 None Detected  NONE DETECTED (Cut Off Level 50 ng/mL) Final   Sodium 01/14/2023 140  135 - 145 mmol/L Final   Potassium 01/14/2023 4.0  3.5 - 5.1 mmol/L Final   Chloride 01/14/2023 95 (L)  98 - 111 mmol/L Final   CO2 01/14/2023 27  22 - 32 mmol/L Final   Glucose, Bld 01/14/2023 88  70 - 99 mg/dL Final   Glucose reference range applies only to samples taken after fasting for at least 8 hours.   BUN 01/14/2023 6  6 - 20 mg/dL Final   Creatinine, Ser 01/14/2023 0.71  0.61 - 1.24 mg/dL Final   Calcium 40/98/1191 9.6  8.9 - 10.3 mg/dL Final   Total Protein 47/82/9562 6.7  6.5 - 8.1 g/dL Final   Albumin 13/01/6577 4.0  3.5 - 5.0 g/dL Final   AST 46/96/2952 160 (H)  15 - 41 U/L Final   ALT 01/14/2023 71 (H)  0 - 44 U/L Final   Alkaline Phosphatase 01/14/2023 65  38 - 126 U/L Final   Total Bilirubin 01/14/2023 0.7  0.3 - 1.2 mg/dL Final   GFR, Estimated 01/14/2023 >60  >60 mL/min Final   Comment: (NOTE) Calculated using the CKD-EPI Creatinine Equation (2021)    Anion gap 01/14/2023 18 (H)  5 - 15 Final   Performed at [facility], [address], [place], [place] [address]   Cholesterol 01/14/2023 288 (H)  0 - 200 mg/dL Final   Triglycerides 44/06/270 169 (H)  <150 mg/dL Final   HDL 53/66/4403 84  >40 mg/dL Final   Total CHOL/HDL Ratio 01/14/2023 3.4  RATIO Final   VLDL 01/14/2023 34  0 - 40 mg/dL Final   LDL Cholesterol 01/14/2023 170 (H)  0 - 99 mg/dL Final   Comment:        Total Cholesterol/HDL:CHD Risk Coronary  Heart Disease Risk Table                     Men   Women  1/2 Average Risk   3.4   3.3  Average Risk       5.0   4.4  2 X Average Risk   9.6   7.1  3 X Average Risk  23.4   11.0        Use the calculated Patient Ratio above and the CHD Risk Table to determine the patient's CHD Risk.        ATP III CLASSIFICATION (LDL):  <100     mg/dL   Optimal  474-259  mg/dL   Near  or Above                    Optimal  130-159  mg/dL   Borderline  595-638  mg/dL   High  >756     mg/dL   Very High Performed at [facility], [address], [place], [place] [address]    Magnesium 01/14/2023 1.9  1.7 - 2.4 mg/dL Final   Performed at [facility], [address], [place], [place] [address]    Blood Alcohol level:  Lab Results  Component Value Date   ETH 152 (H) 01/14/2023   ETH 38 (H) 04/30/2021    Metabolic Disorder Labs: Lab Results  Component Value Date   HGBA1C 5.5 01/14/2023   MPG 111.15 01/14/2023   Lab Results  Component Value Date   PROLACTIN 24.6 (H) 01/14/2023   Lab Results  Component Value Date   CHOL 288 (H) 01/14/2023   TRIG 169 (H) 01/14/2023   HDL 84 01/14/2023   CHOLHDL 3.4 01/14/2023   VLDL 34 01/14/2023   LDLCALC 170 (H) 01/14/2023    Therapeutic Lab Levels: No results found for: "LITHIUM" No results found for: "VALPROATE" No results found for: "CBMZ"  Physical Findings   Flowsheet Row ED from 01/14/2023 in [facility] ED from 11/17/2021 in [facility] Emergency Department at [facility] ED to Hosp-Admission (Discharged) from 04/29/2021 in [facility] LONG 4TH FLOOR PROGRESSIVE CARE AND UROLOGY  C-SSRS RISK CATEGORY No Risk No Risk No Risk        Musculoskeletal  Strength & Muscle Tone: within normal limits Gait & Station: normal Patient leans: N/A  Psychiatric Specialty Exam  Presentation  General Appearance:  Appropriate for Environment  Eye Contact: Fair  Speech: Clear and Coherent  Speech  Volume: Normal  Handedness: Right   Mood and Affect  Mood: Dysphoric  Affect: Congruent   Thought Process  Thought Processes: Coherent  Descriptions of Associations:Intact  Orientation:Full (Time, Place and Person)  Thought Content:Logical  Diagnosis of Schizophrenia or Schizoaffective disorder in past: No    Hallucinations:Hallucinations: None  Ideas of Reference:None  Suicidal Thoughts:Suicidal Thoughts: No  Homicidal Thoughts:Homicidal Thoughts: No   Sensorium  Memory: Immediate Fair; Recent Fair; Remote Fair  Judgment: Fair  Insight: Fair   Art therapist  Concentration: Fair  Attention Span: Fair  Recall: [employer] of Knowledge: Fair  Language: Fair   Psychomotor Activity  Psychomotor Activity: Psychomotor Activity: Normal   Assets  Assets: Communication Skills; Desire for Improvement; Housing; Leisure Time; Physical Health; Resilience   Sleep  Sleep: Sleep: Poor Number of Hours of Sleep: 1.5   No data recorded  Physical Exam  Physical Exam HENT:     Head: Normocephalic.     Nose: Nose normal.  Eyes:     Conjunctiva/sclera: Conjunctivae normal.  Cardiovascular:     Rate and Rhythm: Normal rate.  Pulmonary:     Effort: Pulmonary effort is normal.  Musculoskeletal:        General: Normal range of motion.     Cervical back: Normal range of motion.  Neurological:     Mental Status: He is alert and oriented to person, place, and time.    Review of Systems  Constitutional: Negative.   HENT: Negative.    Eyes: Negative.   Respiratory: Negative.    Cardiovascular: Negative.   Gastrointestinal: Negative.   Genitourinary: Negative.   Musculoskeletal: Negative.   Neurological: Negative.   Endo/Heme/Allergies: Negative.  Blood pressure 106/69, pulse 87, temperature 98.4 F (36.9 C), temperature source Oral, resp. rate 16, SpO2 95%. There is no height or weight on file to calculate BMI.  Treatment Plan  Summary: Patient is awaiting bed availably for the [facility] to continue alcohol detox. Patient is voluntary.   Current medication regimen  [START ON 01/17/2023] buprenorphine-naloxone  1 tablet Sublingual BID   buPROPion  300 mg Oral q morning   escitalopram  10 mg Oral Daily   folic acid  1 mg Oral Daily   gabapentin  300 mg Oral TID   LORazepam  1 mg Oral BID   Followed by   Melene Muller ON 01/17/2023] LORazepam  1 mg Oral Daily   multivitamin with minerals  1 tablet Oral Daily   pantoprazole  40 mg Oral Daily   thiamine  100 mg Oral Daily   Prn medication regimen acetaminophen, albuterol, alum & mag hydroxide-simeth, fluticasone, hydrOXYzine, loperamide, LORazepam, magnesium hydroxide, ondansetron, traZODone   Sarah-Jane Nazario L, NP 01/16/2023 10:10 AM

## 2023-01-16 NOTE — ED Notes (Signed)
Pt sleeping@this time. Breathing even and unlabored. Will continue to monitor for safety 

## 2023-01-17 ENCOUNTER — Other Ambulatory Visit (HOSPITAL_COMMUNITY)
Admission: EM | Admit: 2023-01-17 | Discharge: 2023-01-20 | Disposition: A | Discharge status: Other Acute Inpatient Hospital | Payer: MEDICAID | Source: Home / Self Care | Admitting: Urology

## 2023-01-17 DIAGNOSIS — Z560 Unemployment, unspecified: Secondary | ICD-10-CM | POA: Insufficient documentation

## 2023-01-17 DIAGNOSIS — F1121 Opioid dependence, in remission: Secondary | ICD-10-CM | POA: Insufficient documentation

## 2023-01-17 DIAGNOSIS — F10930 Alcohol use, unspecified with withdrawal, uncomplicated: Secondary | ICD-10-CM

## 2023-01-17 DIAGNOSIS — F1090 Alcohol use, unspecified, uncomplicated: Secondary | ICD-10-CM

## 2023-01-17 DIAGNOSIS — F1024 Alcohol dependence with alcohol-induced mood disorder: Secondary | ICD-10-CM | POA: Insufficient documentation

## 2023-01-17 DIAGNOSIS — F1020 Alcohol dependence, uncomplicated: Secondary | ICD-10-CM | POA: Diagnosis present

## 2023-01-17 DIAGNOSIS — F10239 Alcohol dependence with withdrawal, unspecified: Secondary | ICD-10-CM | POA: Insufficient documentation

## 2023-01-17 MED ORDER — ACETAMINOPHEN 325 MG PO TABS
650.0000 mg | ORAL_TABLET | ORAL | Status: DC
  Administered 2023-01-17 – 2023-01-19 (×7): 650 mg via ORAL
  Filled 2023-01-17 (×7): qty 2

## 2023-01-17 MED ORDER — ALBUTEROL SULFATE HFA 108 (90 BASE) MCG/ACT IN AERS
2.0000 | INHALATION_SPRAY | Freq: Four times a day (QID) | RESPIRATORY_TRACT | Status: DC | PRN
  Administered 2023-01-19: 2 via RESPIRATORY_TRACT
  Filled 2023-01-17 (×2): qty 6.7

## 2023-01-17 MED ORDER — FLUTICASONE PROPIONATE HFA 110 MCG/ACT IN AERO
2.0000 | INHALATION_SPRAY | Freq: Two times a day (BID) | RESPIRATORY_TRACT | Status: DC
  Administered 2023-01-17 – 2023-01-19 (×6): 2 via RESPIRATORY_TRACT

## 2023-01-17 MED ORDER — ALUM & MAG HYDROXIDE-SIMETH 200-200-20 MG/5ML PO SUSP: 30.0000 mL | ORAL | 0 refills | Status: DC | PRN

## 2023-01-17 MED ORDER — ADULT MULTIVITAMIN W/MINERALS CH
1.0000 | ORAL_TABLET | Freq: Every day | ORAL | Status: DC
  Administered 2023-01-17 – 2023-01-19 (×3): 1 via ORAL
  Filled 2023-01-17 (×3): qty 1

## 2023-01-17 MED ORDER — ALUM & MAG HYDROXIDE-SIMETH 200-200-20 MG/5ML PO SUSP
30.0000 mL | ORAL | Status: DC | PRN
  Administered 2023-01-19 (×2): 30 mL via ORAL
  Filled 2023-01-17 (×2): qty 30

## 2023-01-17 MED ORDER — THIAMINE MONONITRATE 100 MG PO TABS
100.0000 mg | ORAL_TABLET | Freq: Every day | ORAL | Status: DC
  Administered 2023-01-18 – 2023-01-19 (×2): 100 mg via ORAL
  Filled 2023-01-17 (×2): qty 1

## 2023-01-17 MED ORDER — ESCITALOPRAM OXALATE 10 MG PO TABS
10.0000 mg | ORAL_TABLET | Freq: Every day | ORAL | Status: DC
  Administered 2023-01-17 – 2023-01-19 (×3): 10 mg via ORAL
  Filled 2023-01-17 (×3): qty 1

## 2023-01-17 MED ORDER — THIAMINE HCL 100 MG/ML IJ SOLN
100.0000 mg | Freq: Once | INTRAMUSCULAR | Status: AC
  Administered 2023-01-17: 100 mg via INTRAMUSCULAR
  Filled 2023-01-17: qty 2

## 2023-01-17 MED ORDER — FOLIC ACID 1 MG PO TABS
1.0000 mg | ORAL_TABLET | Freq: Every day | ORAL | Status: DC
  Administered 2023-01-17 – 2023-01-19 (×3): 1 mg via ORAL
  Filled 2023-01-17 (×3): qty 1

## 2023-01-17 MED ORDER — TRAZODONE HCL 50 MG PO TABS
50.0000 mg | ORAL_TABLET | Freq: Every evening | ORAL | Status: DC | PRN
  Administered 2023-01-17: 50 mg via ORAL
  Filled 2023-01-17 (×2): qty 1

## 2023-01-17 MED ORDER — HYDROXYZINE HCL 25 MG PO TABS
25.0000 mg | ORAL_TABLET | Freq: Four times a day (QID) | ORAL | Status: DC | PRN
  Administered 2023-01-17 – 2023-01-19 (×5): 25 mg via ORAL
  Filled 2023-01-17 (×5): qty 1

## 2023-01-17 MED ORDER — LOPERAMIDE HCL 2 MG PO CAPS: 2.0000 mg | ORAL_CAPSULE | ORAL | Status: DC | PRN

## 2023-01-17 MED ORDER — BUPROPION HCL ER (XL) 300 MG PO TB24
300.0000 mg | ORAL_TABLET | Freq: Every morning | ORAL | Status: DC
  Administered 2023-01-17 – 2023-01-19 (×3): 300 mg via ORAL
  Filled 2023-01-17 (×3): qty 1

## 2023-01-17 MED ORDER — LORAZEPAM 1 MG PO TABS
1.0000 mg | ORAL_TABLET | Freq: Four times a day (QID) | ORAL | Status: DC | PRN
  Administered 2023-01-17 – 2023-01-18 (×2): 1 mg via ORAL
  Filled 2023-01-17 (×3): qty 1

## 2023-01-17 MED ORDER — BUPRENORPHINE HCL-NALOXONE HCL 8-2 MG SL SUBL
1.0000 | SUBLINGUAL_TABLET | Freq: Two times a day (BID) | SUBLINGUAL | Status: DC
  Administered 2023-01-17 – 2023-01-19 (×6): 1 via SUBLINGUAL
  Filled 2023-01-17 (×6): qty 1

## 2023-01-17 MED ORDER — CHLORDIAZEPOXIDE HCL 25 MG PO CAPS
25.0000 mg | ORAL_CAPSULE | Freq: Two times a day (BID) | ORAL | Status: AC
  Administered 2023-01-17 (×2): 25 mg via ORAL
  Filled 2023-01-17 (×2): qty 1

## 2023-01-17 MED ORDER — ONDANSETRON 4 MG PO TBDP
4.0000 mg | ORAL_TABLET | Freq: Four times a day (QID) | ORAL | Status: DC | PRN
  Administered 2023-01-17: 4 mg via ORAL
  Filled 2023-01-17 (×2): qty 1

## 2023-01-17 MED ORDER — PANTOPRAZOLE SODIUM 40 MG PO TBEC
40.0000 mg | DELAYED_RELEASE_TABLET | Freq: Every day | ORAL | Status: DC
  Administered 2023-01-17 – 2023-01-19 (×3): 40 mg via ORAL
  Filled 2023-01-17 (×3): qty 1

## 2023-01-17 MED ORDER — GABAPENTIN 300 MG PO CAPS
300.0000 mg | ORAL_CAPSULE | Freq: Two times a day (BID) | ORAL | Status: DC
  Administered 2023-01-17 (×2): 300 mg via ORAL
  Filled 2023-01-17 (×3): qty 1

## 2023-01-17 MED ORDER — ALBUTEROL SULFATE HFA 108 (90 BASE) MCG/ACT IN AERS: 2.0000 | INHALATION_SPRAY | Freq: Four times a day (QID) | RESPIRATORY_TRACT | 2 refills | Status: AC | PRN

## 2023-01-17 MED ORDER — GABAPENTIN 300 MG PO CAPS
300.0000 mg | ORAL_CAPSULE | Freq: Three times a day (TID) | ORAL | Status: DC
Start: 2023-01-17 — End: 2023-01-17

## 2023-01-17 MED ORDER — ACETAMINOPHEN 325 MG PO TABS: 650.0000 mg | ORAL_TABLET | ORAL | 0 refills | Status: DC

## 2023-01-17 MED ORDER — LORAZEPAM 1 MG PO TABS
1.0000 mg | ORAL_TABLET | Freq: Once | ORAL | Status: AC
  Administered 2023-01-17: 1 mg via ORAL
  Filled 2023-01-17: qty 1

## 2023-01-17 NOTE — ED Provider Notes (Signed)
[facility] ASAP Discharge Summary  Date and Time: 01/17/2023 6:27 AM  Name: Eric Nolan  MRN:  [id number]   Discharge Diagnoses:  Final diagnoses:  None    Subjective: Patient reports desire to continue substance abuse treatment and alcohol detox. He says he is experiencing withdraw symptoms of anxiety, tremors, sweats, and body ache however he reports that they are tolerable with medications and he continues to notice improvement. He says he would like to go to a long term substance abuse treatment program after detox.     Stay Summary: [name] is a 42 year old male with a history of anxiety and alcohol abuse.  Patient presented to voluntarily requesting alcohol detox and substance abuse treatment.  He was admitted to [facility] for continuous assessment while awaiting bed available on [facility]. Patient was started on CIWA protocol with schedule Ativan for the management of alcohol withdraw symptoms, he was also placed on his home medications (Suboxone, wellbutrin XL 300mg /day, Albuterol, lexapro 10mg /day, Fluticasone, Protonix 40mg , trazodone 50mg ). Patient tolerated medication without side effects.  During evaluation today, patient is alert and oriented. Calm and cooperative. Patient's mood is depressed with a congruent affect, thought process is goal directed. No signs of psychosis or mania. He reports alcohol withdraw symptoms of anxiety, sweat, and tremors however he says his symptoms are less severe/intense today. He  continues to deny history of alcohol withdraw seizure and DTs. He denies SI/HI/paranoia/hallucination. Last CIWA at was 10, po Ativan given  Patient will be discharge from [facility] and admitted to [facility] for ongoing management of alcohol withdraw symptoms, stabilization and crisis management.   Total Time spent with patient: 20 minutes  Past Psychiatric History:  Depression, anxiety, alcohol abuse  Past Medical History:  Past Medical History:  Diagnosis Date    Depression    Gallstones     Family History:  Family History  Problem Relation Age of Onset   Valvular heart disease Father    Gout Father    Drug abuse Brother    Alzheimer's disease Maternal Grandmother    Heart attack Paternal Grandfather    Stroke Paternal Grandfather     Family Psychiatric History:  Family History  Problem Relation Age of Onset   Valvular heart disease Father    Gout Father    Drug abuse Brother    Alzheimer's disease Maternal Grandmother    Heart attack Paternal Grandfather    Stroke Paternal Grandfather     Social History:  Social History   Tobacco Use   Smoking status: Former    Current packs/day: 0.00    Average packs/day: 1 pack/day for 5.2 years (5.2 ttl pk-yrs)    Types: Cigarettes    Start date: 04/21/2000    Quit date: 06/23/2005    Years since quitting: 17.5   Smokeless tobacco: Never  Vaping Use   Vaping status: Never Used  Substance Use Topics   Alcohol use: Yes    Alcohol/week: 70.0 standard drinks of alcohol    Types: 70 Standard drinks or equivalent per week    Comment: 5-10 drinks per day   Drug use: Yes    Tobacco Cessation:  A prescription for an FDA-approved tobacco cessation medication provided at discharge  Current Medications:  No current facility-administered medications for this encounter.   Current Outpatient Medications  Medication Sig Dispense Refill   acetaminophen (TYLENOL) 325 MG tablet Take 2 tablets (650 mg total) by mouth 3 (three) times daily at 8am, 2pm and bedtime. 14 tablet 0  albuterol (VENTOLIN HFA) 108 (90 Base) MCG/ACT inhaler Inhale 2 puffs into the lungs every 6 (six) hours as needed for wheezing or shortness of breath. 8 g 2   alum & mag hydroxide-simeth (MAALOX/MYLANTA) 200-200-20 MG/5ML suspension Take 30 mLs by mouth every 4 (four) hours as needed for indigestion. 355 mL 0   buprenorphine-naloxone (SUBOXONE) 8-2 mg SUBL SL tablet Place 1 tablet under the tongue 2 (two) times daily.      buPROPion (WELLBUTRIN XL) 300 MG 24 hr tablet Take 300 mg by mouth every morning.     escitalopram (LEXAPRO) 10 MG tablet Take 10 mg by mouth daily.     fluticasone (FLOVENT HFA) 110 MCG/ACT inhaler Inhale 2 puffs into the lungs 2 (two) times daily. (Patient taking differently: Inhale 2 puffs into the lungs 2 (two) times daily as needed (For shortness of breath).) 1 each 12   folic acid (FOLVITE) 1 MG tablet Take 1 tablet by mouth daily.     omeprazole (PRILOSEC) 20 MG capsule Take 2 capsules (40 mg total) by mouth daily. (Patient taking differently: Take 20 mg by mouth 2 (two) times daily before a meal.) 60 capsule 0   tadalafil (CIALIS) 10 MG tablet Take 10 mg by mouth daily as needed for erectile dysfunction.      PTA Medications:  PTA Medications  Medication Sig   buPROPion (WELLBUTRIN XL) 300 MG 24 hr tablet Take 300 mg by mouth every morning.   fluticasone (FLOVENT HFA) 110 MCG/ACT inhaler Inhale 2 puffs into the lungs 2 (two) times daily. (Patient taking differently: Inhale 2 puffs into the lungs 2 (two) times daily as needed (For shortness of breath).)   omeprazole (PRILOSEC) 20 MG capsule Take 2 capsules (40 mg total) by mouth daily. (Patient taking differently: Take 20 mg by mouth 2 (two) times daily before a meal.)   buprenorphine-naloxone (SUBOXONE) 8-2 mg SUBL SL tablet Place 1 tablet under the tongue 2 (two) times daily.   tadalafil (CIALIS) 10 MG tablet Take 10 mg by mouth daily as needed for erectile dysfunction.   escitalopram (LEXAPRO) 10 MG tablet Take 10 mg by mouth daily.   folic acid (FOLVITE) 1 MG tablet Take 1 tablet by mouth daily.   albuterol (VENTOLIN HFA) 108 (90 Base) MCG/ACT inhaler Inhale 2 puffs into the lungs every 6 (six) hours as needed for wheezing or shortness of breath.   acetaminophen (TYLENOL) 325 MG tablet Take 2 tablets (650 mg total) by mouth 3 (three) times daily at 8am, 2pm and bedtime.   alum & mag hydroxide-simeth (MAALOX/MYLANTA) 200-200-20 MG/5ML  suspension Take 30 mLs by mouth every 4 (four) hours as needed for indigestion.        No data to display          Flowsheet Row ED from 01/17/2023 in [facility] ED from 01/14/2023 in [facility] ED from 11/17/2021 in [facility] Emergency Department at [facility]  C-SSRS RISK CATEGORY No Risk No Risk No Risk       Musculoskeletal  Strength & Muscle Tone: within normal limits Gait & Station: normal Patient leans: Right  Psychiatric Specialty Exam  Presentation  General Appearance:  Appropriate for Environment  Eye Contact: Fair  Speech: Clear and Coherent  Speech Volume: Normal  Handedness: Right   Mood and Affect  Mood: Dysphoric  Affect: Congruent   Thought Process  Thought Processes: Coherent  Descriptions of Associations:Intact  Orientation:Full (Time, Place and Person)  Thought Content:Logical  Diagnosis of Schizophrenia or Schizoaffective disorder in past: No    Hallucinations:Hallucinations: None  Ideas of Reference:None  Suicidal Thoughts:Suicidal Thoughts: No  Homicidal Thoughts:Homicidal Thoughts: No   Sensorium  Memory: Immediate Fair; Recent Fair; Remote Fair  Judgment: Fair  Insight: Fair   Art therapist  Concentration: Fair  Attention Span: Fair  Recall: [employer] of Knowledge: Fair  Language: Fair   Psychomotor Activity  Psychomotor Activity: Psychomotor Activity: Normal   Assets  Assets: Communication Skills; Desire for Improvement; Housing; Leisure Time; Physical Health; Resilience   Sleep  Sleep: Sleep: Poor Number of Hours of Sleep: 1.5   No data recorded  Physical Exam  Physical Exam Vitals and nursing note reviewed.  Constitutional:      General: He is not in acute distress.    Appearance: He is well-developed.  HENT:     Head: Normocephalic and atraumatic.  Eyes:     Conjunctiva/sclera: Conjunctivae  normal.  Cardiovascular:     Rate and Rhythm: Normal rate and regular rhythm.     Heart sounds: No murmur heard. Pulmonary:     Effort: Pulmonary effort is normal. No respiratory distress.     Breath sounds: Normal breath sounds.  Abdominal:     Palpations: Abdomen is soft.     Tenderness: There is no abdominal tenderness.  Musculoskeletal:        General: No swelling.     Cervical back: Neck supple.  Skin:    General: Skin is warm and dry.     Capillary Refill: Capillary refill takes less than 2 seconds.  Neurological:     Mental Status: He is alert and oriented to person, place, and time.  Psychiatric:        Attention and Perception: Attention and perception normal.        Mood and Affect: Mood is anxious and depressed.        Speech: Speech normal.        Behavior: Behavior normal. Behavior is cooperative.        Thought Content: Thought content normal.        Cognition and Memory: Cognition normal.    Review of Systems  Constitutional: Negative.   HENT: Negative.    Eyes: Negative.   Respiratory: Negative.    Cardiovascular: Negative.   Gastrointestinal: Negative.   Genitourinary: Negative.   Musculoskeletal: Negative.   Skin: Negative.   Neurological: Negative.   Endo/Heme/Allergies: Negative.   Psychiatric/Behavioral:  Positive for substance abuse. The patient is nervous/anxious.    Blood pressure 114/74, pulse 79, temperature 98.2 F (36.8 C), temperature source Oral, resp. rate 18, SpO2 97%. There is no height or weight on file to calculate BMI.    Disposition:  [facility]  Ashanti Ratti A Marvina Danner, NP 01/17/2023, 6:27 AM

## 2023-01-17 NOTE — Discharge Instructions (Signed)

## 2023-01-17 NOTE — ED Notes (Signed)
Pt is A & O x 4. Pt is oriented to the unit and provided with meal.  pt is lying on his bed. Pt denies SI/HI/AVH. Will continue to monitor for safety and provide support.

## 2023-01-17 NOTE — Group Note (Signed)
Group Topic: Recovery Basics  Group Date: 01/17/2023 Start Time: 1200 End Time: 1230 Facilitators: Jenean Lindau, RN  Department: [facility]  Number of Participants: 5  Group Focus: chemical dependency education Treatment Modality:  Psychoeducation Interventions utilized were patient education Purpose: enhance coping skills  Name: Eric Nolan Date of Birth: Nov 13, 1980  MR: [id number]    Level of Participation: minimal Quality of Participation: attentive and cooperative Interactions with others: gave feedback Mood/Affect: appropriate Triggers (if applicable):   Cognition: goal directed Progress: Minimal Response:   Plan: follow-up needed  Patients Problems:  Patient Active Problem List   Diagnosis Date Noted   Alcohol use disorder, severe, dependence (HCC) 01/17/2023   Alcohol withdrawal syndrome (HCC) 01/14/2023   Alcohol abuse, daily use 01/14/2023   Drug overdose, accidental or unintentional, initial encounter 04/30/2021   Hypocalcemia 04/30/2021   Hyperglycemia 04/30/2021   Elevated AST (SGOT) 04/30/2021   Alcohol dependence (HCC) 04/30/2021   Aspiration into airway 04/30/2021   Depression

## 2023-01-17 NOTE — ED Provider Notes (Addendum)
Facility Based Crisis Admission H&P  Date: 01/17/23 Patient Name: Eric Nolan MRN: [id number] Chief Complaint: Alcohol withdrawal  Diagnoses:  Final diagnoses:  Alcohol use disorder  Alcohol withdrawal syndrome, uncomplicated (HCC)    HPI:  The patient is a 42 year old male with limited past psychiatric history available for review.  He denies previous inpatient psychiatric admissions or any suicide attempts or self-injurious behavior.  The patient reports a history of alcohol use disorder and opioid use disorder.  He states that his opioid use disorder has been in remission for over a year and that he is taking Suboxone from an online prescriber twice daily.  He reports significant issues with alcohol and significant withdrawal symptomatology.  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  Prior to that the patient was in prison for many years for a charge of distribution of heroin (a charge which the patient feels is unjust).  He reports charges related to sexual offenses that have been pending for the past 3 years, which has prevented him from getting a job or going to graduate school.  He desires to go to residential rehab in order to stop using alcohol.  I discussed with him that his options will be limited, both because of his unwillingness to come off of Suboxone and due to his charges.  It appears he has strong social support from his parents and his girlfriend.  On interview today, the patient appears uncomfortable.  He appears tremulous and states that he is experiencing anxiety.  He says that he has never stopped drinking previously and has no expectation as to what withdrawal symptoms might look like.  He states that he has never experienced a seizure before.  He denies experiencing any suicidal thoughts.  I discussed the plan described below and the patient expressed understanding.  PHQ 2-9:   Flowsheet Row ED from 01/17/2023 in [facility]  Thoughts that you would be better off dead, or of hurting yourself in some way Not at all  PHQ-9 Total Score 8       Flowsheet Row ED from 01/17/2023 in [facility] ED from 01/14/2023 in [facility] ED from 11/17/2021 in [facility] Emergency Department at [facility]  C-SSRS RISK CATEGORY No Risk No Risk No Risk       Screenings    Flowsheet Row Most Recent Value  CIWA-Ar Total 10      Total Time spent with patient: 20 minutes  Musculoskeletal  Strength & Muscle Tone: within normal limits Gait & Station: normal Patient leans: N/A  Psychiatric Specialty Exam  Presentation General Appearance: Appropriate for Environment  Eye Contact:Fair  Speech:Clear and Coherent  Speech Volume:Normal  Handedness:-- (not assessed)   Mood and Affect  Mood:Euthymic  Affect:Congruent   Thought Process  Thought Processes:Coherent; Linear  Descriptions of Associations:Intact  Orientation:Full (Time, Place and Person)  Thought Content:Logical    Hallucinations:Hallucinations: None  Ideas of Reference:None  Suicidal Thoughts:Suicidal Thoughts: No  Homicidal Thoughts:Homicidal Thoughts: No   Sensorium  Memory:Immediate Fair; Recent Fair; Remote Fair  Judgment:Fair  Insight:Fair   Executive Functions  Concentration:Fair  Attention Span:Fair  Recall:Fair  Fund of Knowledge:Fair  Language:Fair   Psychomotor Activity  Psychomotor Activity:Psychomotor Activity: Normal   Assets  Assets:Communication Skills; Resilience   Sleep  Sleep:Sleep: Fair   Nutritional Assessment (For OBS and FBC admissions only) Has the patient  had a weight loss or gain of 10 pounds or more in the last 3 months?: No Has the patient had a decrease in food intake/or appetite?: Yes Does the patient have dental problems?: No Does the  patient have eating habits or behaviors that may be indicators of an eating disorder including binging or inducing vomiting?: No Has the patient recently lost weight without trying?: 0 Has the patient been eating poorly because of a decreased appetite?: 0 Malnutrition Screening Tool Score: 0    Physical Exam Constitutional:      Appearance: the patient is not toxic-appearing.  Pulmonary:     Effort: Pulmonary effort is normal.  Neurological:     General: No focal deficit present.     Mental Status: the patient is alert and oriented to person, place, and time.   Review of Systems  Respiratory:  Negative for shortness of breath.   Cardiovascular:  Negative for chest pain.  Gastrointestinal:  Negative for abdominal pain, constipation, diarrhea, nausea and vomiting.  Neurological:  Negative for headaches.    BP 114/74   Pulse 79   Temp 98.2 F (36.8 C) (Oral)   Resp 18   SpO2 97%    Past Psychiatric History: as above   Is the patient at risk to self? No  Has the patient been a risk to self in the past 6 months? No .    Has the patient been a risk to self within the distant past? No   Is the patient a risk to others? No   Has the patient been a risk to others in the past 6 months? No   Has the patient been a risk to others within the distant past? No   Past Medical History: as above Family History: none Social History: as above   Last Labs:  Admission on 01/14/2023, Discharged on 01/17/2023  Component Date Value Ref Range Status   WBC 01/14/2023 6.3  4.0 - 10.5 K/uL Final   RBC 01/14/2023 5.29  4.22 - 5.81 MIL/uL Final   Hemoglobin 01/14/2023 16.8  13.0 - 17.0 g/dL Final   HCT 13/01/6577 49.4  39.0 - 52.0 % Final   MCV 01/14/2023 93.4  80.0 - 100.0 fL Final   MCH 01/14/2023 31.8  26.0 - 34.0 pg Final   MCHC 01/14/2023 34.0  30.0 - 36.0 g/dL Final   RDW 46/96/2952 14.2  11.5 - 15.5 % Final   Platelets 01/14/2023 264  150 - 400 K/uL Final   nRBC 01/14/2023 0.0  0.0 -  0.2 % Final   Neutrophils Relative % 01/14/2023 67  % Final   Neutro Abs 01/14/2023 4.3  1.7 - 7.7 K/uL Final   Lymphocytes Relative 01/14/2023 20  % Final   Lymphs Abs 01/14/2023 1.2  0.7 - 4.0 K/uL Final   Monocytes Relative 01/14/2023 10  % Final   Monocytes Absolute 01/14/2023 0.6  0.1 - 1.0 K/uL Final   Eosinophils Relative 01/14/2023 1  % Final   Eosinophils Absolute 01/14/2023 0.0  0.0 - 0.5 K/uL Final   Basophils Relative 01/14/2023 1  % Final   Basophils Absolute 01/14/2023 0.1  0.0 - 0.1 K/uL Final   Immature Granulocytes 01/14/2023 1  % Final   Abs Immature Granulocytes 01/14/2023 0.03  0.00 - 0.07 K/uL Final   Performed at [facility], [address], [place], [place] [address]   Hgb A1c MFr Bld 01/14/2023 5.5  4.8 - 5.6 % Final   Comment: (NOTE) Pre  diabetes:          5.7%-6.4%  Diabetes:              >6.4%  Glycemic control for   <7.0% adults with diabetes    Mean Plasma Glucose 01/14/2023 111.15  mg/dL Final   Performed at [facility], [address] [place], [place] [address]   Alcohol, Ethyl (B) 01/14/2023 152 (H)  <10 mg/dL Final   Comment: (NOTE) Lowest detectable limit for serum alcohol is 10 mg/dL.  For medical purposes only. Performed at [facility], [address], [place], [place] [address]    TSH 01/14/2023 2.594  0.350 - 4.500 uIU/mL Final   Comment: Performed by a 3rd Generation assay with a functional sensitivity of <=0.01 uIU/mL. Performed at [facility], [address], [place], [place] [address]    Prolactin 01/14/2023 24.6 (H)  3.9 - 22.7 ng/mL Final   Comment: (NOTE) Performed At: [facility] [address] [place], [place] [id number] Jolene Schimke MD TK:[id number]    Color, Urine 01/14/2023 YELLOW  YELLOW Final   APPearance 01/14/2023 CLEAR  CLEAR Final   Specific Gravity, Urine 01/14/2023 1.009  1.005 - 1.030 Final   pH 01/14/2023 8.0  5.0 - 8.0 Final   Glucose, UA 01/14/2023 NEGATIVE  NEGATIVE  mg/dL Final   Hgb urine dipstick 01/14/2023 NEGATIVE  NEGATIVE Final   Bilirubin Urine 01/14/2023 NEGATIVE  NEGATIVE Final   Ketones, ur 01/14/2023 NEGATIVE  NEGATIVE mg/dL Final   Protein, ur 57/32/2025 NEGATIVE  NEGATIVE mg/dL Final   Nitrite 42/70/6237 NEGATIVE  NEGATIVE Final   Leukocytes,Ua 01/14/2023 NEGATIVE  NEGATIVE Final   Performed at [facility], [address], [place], [place] [address]   POC Amphetamine UR 01/14/2023 None Detected  NONE DETECTED (Cut Off Level 1000 ng/mL) Final   POC Secobarbital (BAR) 01/14/2023 None Detected  NONE DETECTED (Cut Off Level 300 ng/mL) Final   POC Buprenorphine (BUP) 01/14/2023 Positive (A)  NONE DETECTED (Cut Off Level 10 ng/mL) Final   POC Oxazepam (BZO) 01/14/2023 None Detected  NONE DETECTED (Cut Off Level 300 ng/mL) Final   POC Cocaine UR 01/14/2023 None Detected  NONE DETECTED (Cut Off Level 300 ng/mL) Final   POC Methamphetamine UR 01/14/2023 None Detected  NONE DETECTED (Cut Off Level 1000 ng/mL) Final   POC Morphine 01/14/2023 None Detected  NONE DETECTED (Cut Off Level 300 ng/mL) Final   POC Methadone UR 01/14/2023 None Detected  NONE DETECTED (Cut Off Level 300 ng/mL) Final   POC Oxycodone UR 01/14/2023 None Detected  NONE DETECTED (Cut Off Level 100 ng/mL) Final   POC Marijuana UR 01/14/2023 None Detected  NONE DETECTED (Cut Off Level 50 ng/mL) Final   Sodium 01/14/2023 140  135 - 145 mmol/L Final   Potassium 01/14/2023 4.0  3.5 - 5.1 mmol/L Final   Chloride 01/14/2023 95 (L)  98 - 111 mmol/L Final   CO2 01/14/2023 27  22 - 32 mmol/L Final   Glucose, Bld 01/14/2023 88  70 - 99 mg/dL Final   Glucose reference range applies only to samples taken after fasting for at least 8 hours.   BUN 01/14/2023 6  6 - 20 mg/dL Final   Creatinine, Ser 01/14/2023 0.71  0.61 - 1.24 mg/dL Final   Calcium 51/76/1607 9.6  8.9 - 10.3 mg/dL Final   Total Protein 37/03/6268 6.7  6.5 - 8.1 g/dL Final   Albumin 48/54/6270 4.0  3.5 - 5.0 g/dL Final  AST 01/14/2023 160 (H)  15 - 41 U/L Final   ALT 01/14/2023 71 (H)  0 - 44 U/L Final   Alkaline Phosphatase 01/14/2023 65  38 - 126 U/L Final   Total Bilirubin 01/14/2023 0.7  0.3 - 1.2 mg/dL Final   GFR, Estimated 01/14/2023 >60  >60 mL/min Final   Comment: (NOTE) Calculated using the CKD-EPI Creatinine Equation (2021)    Anion gap 01/14/2023 18 (H)  5 - 15 Final   Performed at [facility], [address], [place], [place] [address]   Cholesterol 01/14/2023 288 (H)  0 - 200 mg/dL Final   Triglycerides 03/47/4259 169 (H)  <150 mg/dL Final   HDL 56/38/7564 84  >40 mg/dL Final   Total CHOL/HDL Ratio 01/14/2023 3.4  RATIO Final   VLDL 01/14/2023 34  0 - 40 mg/dL Final   LDL Cholesterol 01/14/2023 170 (H)  0 - 99 mg/dL Final   Comment:        Total Cholesterol/HDL:CHD Risk Coronary Heart Disease Risk Table                     Men   Women  1/2 Average Risk   3.4   3.3  Average Risk       5.0   4.4  2 X Average Risk   9.6   7.1  3 X Average Risk  23.4   11.0        Use the calculated Patient Ratio above and the CHD Risk Table to determine the patient's CHD Risk.        ATP III CLASSIFICATION (LDL):  <100     mg/dL   Optimal  332-951  mg/dL   Near or Above                    Optimal  130-159  mg/dL   Borderline  884-166  mg/dL   High  >063     mg/dL   Very High Performed at [facility], [address] [place], [place] [address]    Magnesium 01/14/2023 1.9  1.7 - 2.4 mg/dL Final   Performed at [facility], [address] [place], [place] [address]    Allergies: Patient has no known allergies.  Medications:  Facility Ordered Medications  Medication   escitalopram (LEXAPRO) tablet 10 mg   buPROPion (WELLBUTRIN XL) 24 hr tablet 300 mg   buprenorphine-naloxone (SUBOXONE) 8-2 mg per SL tablet 1 tablet   fluticasone (FLOVENT HFA) 110 MCG/ACT inhaler 2 puff   folic acid (FOLVITE) tablet 1 mg   pantoprazole (PROTONIX) EC tablet 40 mg   alum & mag  hydroxide-simeth (MAALOX/MYLANTA) 200-200-20 MG/5ML suspension 30 mL   albuterol (VENTOLIN HFA) 108 (90 Base) MCG/ACT inhaler 2 puff   acetaminophen (TYLENOL) tablet 650 mg   [COMPLETED] thiamine (VITAMIN B1) injection 100 mg   [START ON 01/18/2023] thiamine (VITAMIN B1) tablet 100 mg   multivitamin with minerals tablet 1 tablet   LORazepam (ATIVAN) tablet 1 mg   hydrOXYzine (ATARAX) tablet 25 mg   loperamide (IMODIUM) capsule 2-4 mg   ondansetron (ZOFRAN-ODT) disintegrating tablet 4 mg   [COMPLETED] LORazepam (ATIVAN) tablet 1 mg   chlordiazePOXIDE (LIBRIUM) capsule 25 mg   gabapentin (NEURONTIN) capsule 300 mg   PTA Medications  Medication Sig   buPROPion (WELLBUTRIN XL) 300 MG 24 hr tablet Take 300 mg by mouth every morning.   fluticasone (FLOVENT HFA) 110 MCG/ACT inhaler Inhale  2 puffs into the lungs 2 (two) times daily. (Patient taking differently: Inhale 2 puffs into the lungs 2 (two) times daily as needed (For shortness of breath).)   omeprazole (PRILOSEC) 20 MG capsule Take 2 capsules (40 mg total) by mouth daily. (Patient taking differently: Take 20 mg by mouth 2 (two) times daily before a meal.)   buprenorphine-naloxone (SUBOXONE) 8-2 mg SUBL SL tablet Place 1 tablet under the tongue 2 (two) times daily.   tadalafil (CIALIS) 10 MG tablet Take 10 mg by mouth daily as needed for erectile dysfunction.   escitalopram (LEXAPRO) 10 MG tablet Take 10 mg by mouth daily.   folic acid (FOLVITE) 1 MG tablet Take 1 tablet by mouth daily.   albuterol (VENTOLIN HFA) 108 (90 Base) MCG/ACT inhaler Inhale 2 puffs into the lungs every 6 (six) hours as needed for wheezing or shortness of breath.   acetaminophen (TYLENOL) 325 MG tablet Take 2 tablets (650 mg total) by mouth 3 (three) times daily at 8am, 2pm and bedtime.   alum & mag hydroxide-simeth (MAALOX/MYLANTA) 200-200-20 MG/5ML suspension Take 30 mLs by mouth every 4 (four) hours as needed for indigestion.    Long Term Goals: Improvement in  symptoms so as ready for discharge  Short Term Goals: Patient will verbalize feelings in meetings with treatment team members., Patient will attend at least of 50% of the groups daily., Pt will complete the PHQ9 on admission, day 3 and discharge., and Patient will participate in completing the [place] Suicide Severity Rating Scale  Medical Decision Making  Alcohol use disorder, currently experiencing significant withdrawal symptoms, no history of seizure - Start Librium 25 mg twice daily for 2 doses today, patient is 4 days out from his last drink but still appears to be in withdrawal - CIWA with as needed Ativan - Start gabapentin 300 mg twice daily - Gabapentin will be helpful as an outpatient medication for this patient - Residential rehab placement if possible, two complicating factors include Suboxone dependence and pending sex offender charges - Continue home Suboxone 8 mg twice daily, PDMP shows consistent fills for this medication  Substance-induced mood disorder - Continue home Wellbutrin 300 mg XL and home Lexapro 10 mg daily - Patient reports longstanding use of these medications and states that he takes them every day.    Recommendations  Based on my evaluation the patient does not appear to have an emergency medical condition.  Carlyn Reichert, MD 01/17/23  11:47 AM

## 2023-01-17 NOTE — ED Notes (Signed)
Message sent to the provider about sending patient to [facility]

## 2023-01-17 NOTE — ED Notes (Signed)
Pt is in the bedroom composed and reading. Respirations are even and unlabored. No acute distress noted. Will continue to monitor for safety.

## 2023-01-17 NOTE — ED Notes (Signed)
Pt is in the bedroom composed and resting well. Respirations are even and unlabored. No acute distress noted. Will continue to monitor for safety.

## 2023-01-17 NOTE — ED Notes (Signed)
Patient asleep in bed. Will monitor.

## 2023-01-17 NOTE — ED Notes (Signed)
Patients CIWA is a 10 additional dose of 1mg  ativan and zofran for nausea given.  MD Jerrel Ivory made aware.  Patient is tremulous and nauseous no vomiting.   Will monitor.

## 2023-01-18 MED ORDER — CHLORDIAZEPOXIDE HCL 25 MG PO CAPS
25.0000 mg | ORAL_CAPSULE | Freq: Two times a day (BID) | ORAL | Status: AC
  Administered 2023-01-18 (×2): 25 mg via ORAL
  Filled 2023-01-18 (×2): qty 1

## 2023-01-18 MED ORDER — GABAPENTIN 300 MG PO CAPS
300.0000 mg | ORAL_CAPSULE | Freq: Three times a day (TID) | ORAL | Status: DC
  Administered 2023-01-18 – 2023-01-19 (×6): 300 mg via ORAL
  Filled 2023-01-18 (×5): qty 1

## 2023-01-18 NOTE — ED Notes (Signed)
Pt is in the bedroom sleeping.  Respirations are even and unlabored. No acute distress noted. Will continue to monitor for safety.  

## 2023-01-18 NOTE — ED Notes (Signed)
Patient is in the bedroom sleeping.NAD.  Respirations are even and unlabored.  Will continue to monitor for safety.

## 2023-01-18 NOTE — ED Notes (Signed)
Patient has been awake and alert on unit.  He is calm and pleasant, social with peers.  Continues to have etoh withdrawal of tremors however he is improving daily and tolerating librium taper.  Will continue to monitor and manage symptoms as they arise.

## 2023-01-18 NOTE — ED Notes (Signed)
Patients ciwa is 10 1mg  ativan PO PRN given.  Visible tremors, headache and sweating.

## 2023-01-18 NOTE — Group Note (Signed)
Group Topic: Relaxation  Group Date: 01/18/2023 Start Time: 1300 End Time: 1345 Facilitators: Rico Sheehan, LPN  Department: [facility]  Number of Participants: 3  Group Focus: feeling awareness/expression and relaxation Treatment Modality:  Leisure Counsellor and Patient-Centered Therapy Interventions utilized were exploration, reminiscence, and story telling Purpose: regain self-worth and reinforce self-care  Name: Eric Nolan Date of Birth: Nov 28, 1980  MR: [id number]    Level of Participation: active Quality of Participation: engaged Interactions with others: gave feedback Mood/Affect: appropriate Triggers (if applicable):  Cognition: coherent/clear Progress: Moderate Response:  Plan: follow-up needed  Patients Problems:  Patient Active Problem List   Diagnosis Date Noted   Alcohol use disorder, severe, dependence (HCC) 01/17/2023   Alcohol withdrawal syndrome (HCC) 01/14/2023   Alcohol abuse, daily use 01/14/2023   Drug overdose, accidental or unintentional, initial encounter 04/30/2021   Hypocalcemia 04/30/2021   Hyperglycemia 04/30/2021   Elevated AST (SGOT) 04/30/2021   Alcohol dependence (HCC) 04/30/2021   Aspiration into airway 04/30/2021   Depression

## 2023-01-18 NOTE — ED Provider Notes (Signed)
Behavioral Health Progress Note  Date and Time: 01/18/2023 9:09 AM Name: Eric Nolan MRN:  [id number]  Subjective:   The patient is a 42 year old male with limited past psychiatric history available for review.  He reports a history of opioid use disorder (which he now states has been in remission for approximately a year, while on Suboxone).  He also reports a history of alcohol use disorder, which has been severe over the past several years.  Patient denies any inpatient psychiatric hospitalizations or suicide attempts.  Patient presented to the [facility] behavioral urgent care requesting help with alcohol detox and residential rehab placement.  It has been discussed with the patient that residential rehab placement may be made difficult by the fact that he is on Suboxone and the fact that he has pending charges related to sexual offenses.  It appears that he has strong social support from his parents and his girlfriend and could engage in outpatient program if necessary.  The patient reports improvement in his withdrawal symptoms today.  He appears more comfortable.  He had a restful night sleep.  The patient reports good mood, appetite, and sleep. He denies suicidal and homicidal thoughts. He denies side effects from his medications.  Review of systems as below.     Diagnosis:  Final diagnoses:  Alcohol use disorder  Alcohol withdrawal syndrome, uncomplicated (HCC)   Total Time spent with patient: 20 minutes  Past Psychiatric History: as above Past Medical History: as above Family History: none Family Psychiatric  History: none Social History: as above and per H and P  Additional Social History:  See H and P                  Sleep: Fair  Appetite:  Fair   Current Medications:  Current Facility-Administered Medications  Medication Dose Route Frequency Provider Last Rate Last Admin   acetaminophen (TYLENOL) tablet 650 mg  650 mg Oral [id number] Ajibola,  Ene A, NP   650 mg at 01/17/23 2113   albuterol (VENTOLIN HFA) 108 (90 Base) MCG/ACT inhaler 2 puff  2 puff Inhalation Q6H PRN Ajibola, Ene A, NP       alum & mag hydroxide-simeth (MAALOX/MYLANTA) 200-200-20 MG/5ML suspension 30 mL  30 mL Oral Q4H PRN Ajibola, Ene A, NP       buprenorphine-naloxone (SUBOXONE) 8-2 mg per SL tablet 1 tablet  1 tablet Sublingual BID Ajibola, Ene A, NP   1 tablet at 01/17/23 2113   buPROPion (WELLBUTRIN XL) 24 hr tablet 300 mg  300 mg Oral q morning Ajibola, Ene A, NP   300 mg at 01/17/23 0934   chlordiazePOXIDE (LIBRIUM) capsule 25 mg  25 mg Oral BID Carlyn Reichert, MD       escitalopram (LEXAPRO) tablet 10 mg  10 mg Oral Daily Ajibola, Ene A, NP   10 mg at 01/17/23 0934   fluticasone (FLOVENT HFA) 110 MCG/ACT inhaler 2 puff  2 puff Inhalation BID Ajibola, Ene A, NP   2 puff at 01/17/23 2137   folic acid (FOLVITE) tablet 1 mg  1 mg Oral Daily Ajibola, Ene A, NP   1 mg at 01/17/23 0934   gabapentin (NEURONTIN) capsule 300 mg  300 mg Oral TID Carlyn Reichert, MD       hydrOXYzine (ATARAX) tablet 25 mg  25 mg Oral Q6H PRN Ajibola, Ene A, NP   25 mg at 01/17/23 2254   loperamide (IMODIUM) capsule 2-4 mg  2-4 mg Oral PRN Ajibola,  Ene A, NP       LORazepam (ATIVAN) tablet 1 mg  1 mg Oral Q6H PRN Ajibola, Ene A, NP   1 mg at 01/17/23 1036   multivitamin with minerals tablet 1 tablet  1 tablet Oral Daily Ajibola, Ene A, NP   1 tablet at 01/17/23 0934   ondansetron (ZOFRAN-ODT) disintegrating tablet 4 mg  4 mg Oral Q6H PRN Ajibola, Ene A, NP   4 mg at 01/17/23 1036   pantoprazole (PROTONIX) EC tablet 40 mg  40 mg Oral Daily Ajibola, Ene A, NP   40 mg at 01/17/23 0934   thiamine (VITAMIN B1) tablet 100 mg  100 mg Oral Daily Ajibola, Ene A, NP       traZODone (DESYREL) tablet 50 mg  50 mg Oral QHS PRN Carlyn Reichert, MD   50 mg at 01/17/23 2114   Current Outpatient Medications  Medication Sig Dispense Refill   acetaminophen (TYLENOL) 325 MG tablet Take 2 tablets (650 mg  total) by mouth 3 (three) times daily at 8am, 2pm and bedtime. 14 tablet 0   albuterol (VENTOLIN HFA) 108 (90 Base) MCG/ACT inhaler Inhale 2 puffs into the lungs every 6 (six) hours as needed for wheezing or shortness of breath. 8 g 2   alum & mag hydroxide-simeth (MAALOX/MYLANTA) 200-200-20 MG/5ML suspension Take 30 mLs by mouth every 4 (four) hours as needed for indigestion. 355 mL 0   buprenorphine-naloxone (SUBOXONE) 8-2 mg SUBL SL tablet Place 1 tablet under the tongue 2 (two) times daily.     buPROPion (WELLBUTRIN XL) 300 MG 24 hr tablet Take 300 mg by mouth every morning.     escitalopram (LEXAPRO) 10 MG tablet Take 10 mg by mouth daily.     fluticasone (FLOVENT HFA) 110 MCG/ACT inhaler Inhale 2 puffs into the lungs 2 (two) times daily. (Patient taking differently: Inhale 2 puffs into the lungs 2 (two) times daily as needed (For shortness of breath).) 1 each 12   folic acid (FOLVITE) 1 MG tablet Take 1 tablet by mouth daily.     omeprazole (PRILOSEC) 20 MG capsule Take 2 capsules (40 mg total) by mouth daily. (Patient taking differently: Take 20 mg by mouth 2 (two) times daily before a meal.) 60 capsule 0   tadalafil (CIALIS) 10 MG tablet Take 10 mg by mouth daily as needed for erectile dysfunction.      Labs  Lab Results:  Admission on 01/14/2023, Discharged on 01/17/2023  Component Date Value Ref Range Status   WBC 01/14/2023 6.3  4.0 - 10.5 K/uL Final   RBC 01/14/2023 5.29  4.22 - 5.81 MIL/uL Final   Hemoglobin 01/14/2023 16.8  13.0 - 17.0 g/dL Final   HCT 40/34/7425 49.4  39.0 - 52.0 % Final   MCV 01/14/2023 93.4  80.0 - 100.0 fL Final   MCH 01/14/2023 31.8  26.0 - 34.0 pg Final   MCHC 01/14/2023 34.0  30.0 - 36.0 g/dL Final   RDW 95/63/8756 14.2  11.5 - 15.5 % Final   Platelets 01/14/2023 264  150 - 400 K/uL Final   nRBC 01/14/2023 0.0  0.0 - 0.2 % Final   Neutrophils Relative % 01/14/2023 67  % Final   Neutro Abs 01/14/2023 4.3  1.7 - 7.7 K/uL Final   Lymphocytes Relative  01/14/2023 20  % Final   Lymphs Abs 01/14/2023 1.2  0.7 - 4.0 K/uL Final   Monocytes Relative 01/14/2023 10  % Final   Monocytes Absolute 01/14/2023 0.6  0.1 -  1.0 K/uL Final   Eosinophils Relative 01/14/2023 1  % Final   Eosinophils Absolute 01/14/2023 0.0  0.0 - 0.5 K/uL Final   Basophils Relative 01/14/2023 1  % Final   Basophils Absolute 01/14/2023 0.1  0.0 - 0.1 K/uL Final   Immature Granulocytes 01/14/2023 1  % Final   Abs Immature Granulocytes 01/14/2023 0.03  0.00 - 0.07 K/uL Final   Performed at [facility], [address], [place], [place] [address]   Hgb A1c MFr Bld 01/14/2023 5.5  4.8 - 5.6 % Final   Comment: (NOTE) Pre diabetes:          5.7%-6.4%  Diabetes:              >6.4%  Glycemic control for   <7.0% adults with diabetes    Mean Plasma Glucose 01/14/2023 111.15  mg/dL Final   Performed at [facility], [address] [place], [place] [address]   Alcohol, Ethyl (B) 01/14/2023 152 (H)  <10 mg/dL Final   Comment: (NOTE) Lowest detectable limit for serum alcohol is 10 mg/dL.  For medical purposes only. Performed at [facility], [address] [place], [place] [address]    TSH 01/14/2023 2.594  0.350 - 4.500 uIU/mL Final   Comment: Performed by a 3rd Generation assay with a functional sensitivity of <=0.01 uIU/mL. Performed at [facility], [address], [place], [place] [address]    Prolactin 01/14/2023 24.6 (H)  3.9 - 22.7 ng/mL Final   Comment: (NOTE) Performed At: [facility] [address] [place], [place] [id number] Jolene Schimke MD [id number]    Color, Urine 01/14/2023 YELLOW  YELLOW Final   APPearance 01/14/2023 CLEAR  CLEAR Final   Specific Gravity, Urine 01/14/2023 1.009  1.005 - 1.030 Final   pH 01/14/2023 8.0  5.0 - 8.0 Final   Glucose, UA 01/14/2023 NEGATIVE  NEGATIVE mg/dL Final   Hgb urine dipstick 01/14/2023 NEGATIVE  NEGATIVE Final   Bilirubin Urine 01/14/2023 NEGATIVE  NEGATIVE Final   Ketones,  ur 01/14/2023 NEGATIVE  NEGATIVE mg/dL Final   Protein, ur 02/72/5366 NEGATIVE  NEGATIVE mg/dL Final   Nitrite 44/08/4740 NEGATIVE  NEGATIVE Final   Leukocytes,Ua 01/14/2023 NEGATIVE  NEGATIVE Final   Performed at [facility], [address], [place], [place] [address]   POC Amphetamine UR 01/14/2023 None Detected  NONE DETECTED (Cut Off Level 1000 ng/mL) Final   POC Secobarbital (BAR) 01/14/2023 None Detected  NONE DETECTED (Cut Off Level 300 ng/mL) Final   POC Buprenorphine (BUP) 01/14/2023 Positive (A)  NONE DETECTED (Cut Off Level 10 ng/mL) Final   POC Oxazepam (BZO) 01/14/2023 None Detected  NONE DETECTED (Cut Off Level 300 ng/mL) Final   POC Cocaine UR 01/14/2023 None Detected  NONE DETECTED (Cut Off Level 300 ng/mL) Final   POC Methamphetamine UR 01/14/2023 None Detected  NONE DETECTED (Cut Off Level 1000 ng/mL) Final   POC Morphine 01/14/2023 None Detected  NONE DETECTED (Cut Off Level 300 ng/mL) Final   POC Methadone UR 01/14/2023 None Detected  NONE DETECTED (Cut Off Level 300 ng/mL) Final   POC Oxycodone UR 01/14/2023 None Detected  NONE DETECTED (Cut Off Level 100 ng/mL) Final   POC Marijuana UR 01/14/2023 None Detected  NONE DETECTED (Cut Off Level 50 ng/mL) Final   Sodium 01/14/2023 140  135 - 145 mmol/L Final   Potassium 01/14/2023 4.0  3.5 - 5.1 mmol/L Final   Chloride 01/14/2023 95 (L)  98 -  111 mmol/L Final   CO2 01/14/2023 27  22 - 32 mmol/L Final   Glucose, Bld 01/14/2023 88  70 - 99 mg/dL Final   Glucose reference range applies only to samples taken after fasting for at least 8 hours.   BUN 01/14/2023 6  6 - 20 mg/dL Final   Creatinine, Ser 01/14/2023 0.71  0.61 - 1.24 mg/dL Final   Calcium 57/84/6962 9.6  8.9 - 10.3 mg/dL Final   Total Protein 95/28/4132 6.7  6.5 - 8.1 g/dL Final   Albumin 44/06/270 4.0  3.5 - 5.0 g/dL Final   AST 53/66/4403 160 (H)  15 - 41 U/L Final   ALT 01/14/2023 71 (H)  0 - 44 U/L Final   Alkaline Phosphatase 01/14/2023 65  38 - 126 U/L  Final   Total Bilirubin 01/14/2023 0.7  0.3 - 1.2 mg/dL Final   GFR, Estimated 01/14/2023 >60  >60 mL/min Final   Comment: (NOTE) Calculated using the CKD-EPI Creatinine Equation (2021)    Anion gap 01/14/2023 18 (H)  5 - 15 Final   Performed at [facility], [address] [place], [place] [address]   Cholesterol 01/14/2023 288 (H)  0 - 200 mg/dL Final   Triglycerides 95/63/8756 169 (H)  <150 mg/dL Final   HDL 43/32/9518 84  >40 mg/dL Final   Total CHOL/HDL Ratio 01/14/2023 3.4  RATIO Final   VLDL 01/14/2023 34  0 - 40 mg/dL Final   LDL Cholesterol 01/14/2023 170 (H)  0 - 99 mg/dL Final   Comment:        Total Cholesterol/HDL:CHD Risk Coronary Heart Disease Risk Table                     Men   Women  1/2 Average Risk   3.4   3.3  Average Risk       5.0   4.4  2 X Average Risk   9.6   7.1  3 X Average Risk  23.4   11.0        Use the calculated Patient Ratio above and the CHD Risk Table to determine the patient's CHD Risk.        ATP III CLASSIFICATION (LDL):  <100     mg/dL   Optimal  841-660  mg/dL   Near or Above                    Optimal  130-159  mg/dL   Borderline  630-160  mg/dL   High  >109     mg/dL   Very High Performed at [facility], [address], [place], [place] [address]    Magnesium 01/14/2023 1.9  1.7 - 2.4 mg/dL Final   Performed at [facility], [address] [place], [place] [address]    Blood Alcohol level:  Lab Results  Component Value Date   ETH 152 (H) 01/14/2023   ETH 38 (H) 04/30/2021    Metabolic Disorder Labs: Lab Results  Component Value Date   HGBA1C 5.5 01/14/2023   MPG 111.15 01/14/2023   Lab Results  Component Value Date   PROLACTIN 24.6 (H) 01/14/2023   Lab Results  Component Value Date   CHOL 288 (H) 01/14/2023   TRIG 169 (H) 01/14/2023   HDL 84 01/14/2023   CHOLHDL 3.4 01/14/2023   VLDL 34 01/14/2023   LDLCALC 170 (H) 01/14/2023    Therapeutic Lab Levels: No results found for:  "  LITHIUM" No results found for: "VALPROATE" No results found for: "CBMZ"  Physical Findings   PHQ2-9    Flowsheet Row ED from 01/17/2023 in [facility]  PHQ-2 Total Score 2  PHQ-9 Total Score 8      Flowsheet Row ED from 01/17/2023 in [facility] ED from 01/14/2023 in [facility] ED from 11/17/2021 in [facility] Emergency Department at [facility]  C-SSRS RISK CATEGORY No Risk No Risk No Risk        Musculoskeletal  Strength & Muscle Tone: within normal limits Gait & Station: normal Patient leans: N/A  Psychiatric Specialty Exam  Presentation General Appearance: Appropriate for Environment  Eye Contact:Fair  Speech:Clear and Coherent  Speech Volume:Normal  Handedness:-- (not assessed)   Mood and Affect  Mood:Euthymic  Affect:Congruent   Thought Process  Thought Processes:Coherent; Linear  Descriptions of Associations:Intact  Orientation:Full (Time, Place and Person)  Thought Content:Logical    Hallucinations:Hallucinations: None  Ideas of Reference:None  Suicidal Thoughts:Suicidal Thoughts: No  Homicidal Thoughts:Homicidal Thoughts: No   Sensorium  Memory:Immediate Fair; Recent Fair; Remote Fair  Judgment:Fair  Insight:Fair   Executive Functions  Concentration:Fair  Attention Span:Fair  Recall:Fair  Fund of Knowledge:Fair  Language:Fair   Psychomotor Activity  Psychomotor Activity:Psychomotor Activity: Normal   Assets  Assets:Communication Skills; Resilience   Sleep  Sleep:Sleep: Fair   Nutritional Assessment (For OBS and FBC admissions only) Has the patient had a weight loss or gain of 10 pounds or more in the last 3 months?: No Has the patient had a decrease in food intake/or appetite?: Yes Does the patient have dental problems?: No Does the patient have eating habits or behaviors that may be indicators of an eating disorder  including binging or inducing vomiting?: No Has the patient recently lost weight without trying?: 0 Has the patient been eating poorly because of a decreased appetite?: 0 Malnutrition Screening Tool Score: 0    Physical Exam Constitutional:      Appearance: the patient is not toxic-appearing.  Pulmonary:     Effort: Pulmonary effort is normal.  Neurological:     General: No focal deficit present.     Mental Status: the patient is alert and oriented to person, place, and time.   Review of Systems  Respiratory:  Negative for shortness of breath.   Cardiovascular:  Negative for chest pain.  Gastrointestinal:  Negative for abdominal pain, constipation, diarrhea, nausea and vomiting.  Neurological:  Negative for headaches.    BP 103/65 (BP Location: Left Arm)   Pulse 85   Temp 98.3 F (36.8 C) (Oral)   Resp 18   SpO2 95%   Assessment and Plan: Alcohol use disorder, currently experiencing significant withdrawal symptoms, no history of seizure - Librium 25 mg to be given twice today, will reassess on Monday - CIWA with as needed Ativan - Increase gabapentin to 300 mg 3 times daily - Gabapentin will be helpful as an outpatient medication for this patient due to his severe withdrawal symptoms - Residential rehab placement if possible, two complicating factors include Suboxone dependence and pending sex offender charges - Continue home Suboxone 8 mg twice daily, PDMP shows consistent fills for this medication   Substance-induced mood disorder - Continue home Wellbutrin 300 mg XL and home Lexapro 10 mg daily - Patient reports longstanding use of these medications and states that he takes them every day.  Carlyn Reichert, MD PGY-3

## 2023-01-18 NOTE — Group Note (Signed)
Group Topic: Relapse and Recovery  Group Date: 01/17/2023 Start Time: 2000 End Time: 2030 Facilitators: Emmit Pomfret D, NT  Department: [facility]  Number of Participants: 4  Group Focus: relapse prevention Treatment Modality:  Psychoeducation Interventions utilized were support Purpose: relapse prevention strategies  Name: Kimsey Velardo Date of Birth: 1980/09/01  MR: [id number]    Level of Participation: moderate Quality of Participation: attentive Interactions with others: gave feedback Mood/Affect: appropriate Triggers (if applicable): n/a Cognition: coherent/clear Progress: Significant Response: n/a Plan: follow-up needed  Patients Problems:  Patient Active Problem List   Diagnosis Date Noted   Alcohol use disorder, severe, dependence (HCC) 01/17/2023   Alcohol withdrawal syndrome (HCC) 01/14/2023   Alcohol abuse, daily use 01/14/2023   Drug overdose, accidental or unintentional, initial encounter 04/30/2021   Hypocalcemia 04/30/2021   Hyperglycemia 04/30/2021   Elevated AST (SGOT) 04/30/2021   Alcohol dependence (HCC) 04/30/2021   Aspiration into airway 04/30/2021   Depression

## 2023-01-19 MED ORDER — PANTOPRAZOLE SODIUM 40 MG PO TBEC
40.0000 mg | DELAYED_RELEASE_TABLET | Freq: Two times a day (BID) | ORAL | Status: DC
  Administered 2023-01-19: 40 mg via ORAL
  Filled 2023-01-19: qty 1

## 2023-01-19 MED ORDER — PANTOPRAZOLE SODIUM 20 MG PO TBEC: 20.0000 mg | DELAYED_RELEASE_TABLET | Freq: Three times a day (TID) | ORAL | Status: DC

## 2023-01-19 MED ORDER — LORAZEPAM 1 MG PO TABS
1.0000 mg | ORAL_TABLET | Freq: Two times a day (BID) | ORAL | Status: DC
  Administered 2023-01-19 (×2): 1 mg via ORAL
  Filled 2023-01-19 (×2): qty 1

## 2023-01-19 MED ORDER — LORAZEPAM 1 MG PO TABS: 1.0000 mg | ORAL_TABLET | Freq: Once | ORAL | Status: DC

## 2023-01-19 NOTE — Care Management (Signed)
Care Management   11:22am  Writer referred patient to [facility].  Writer left a voicemail message with [facility] and [facility] in order to follow up  on referral that was previously faxed.

## 2023-01-19 NOTE — Progress Notes (Signed)
Pt's CIWA was 6 and COWS 7.

## 2023-01-19 NOTE — Progress Notes (Addendum)
Pt is awake, alert and oriented X4. Pt complained of neck pain. No signs of acute distress noted. Pt's CIWA was 6 and  COWS 9. Administered scheduled meds with no issue.  Pt denies current SI/HI/AVH, plan or intent. Staff will monitor for pt's safety.

## 2023-01-19 NOTE — Group Note (Signed)
Group Topic: Relapse and Recovery  Group Date: 01/19/2023 Start Time: 1000 End Time: 1100 Facilitators: Donnie Coffin, NT  Department: [facility]  Number of Participants: 4  Group Focus: chemical dependency education Treatment Modality:  Patient-Centered Therapy Interventions utilized were group exercise Purpose: relapse prevention strategies  Name: Eric Nolan Date of Birth: [birth year]  MR: [id number]    Level of Participation: active Quality of Participation: cooperative Interactions with others: gave feedback Mood/Affect: appropriate Triggers (if applicable): none Cognition: insightful Progress: Gaining insight Response: full Plan: patient will be encouraged to attend group  Patients Problems:  Patient Active Problem List   Diagnosis Date Noted   Alcohol use disorder, severe, dependence (HCC) 01/17/2023   Alcohol withdrawal syndrome (HCC) 01/14/2023   Alcohol abuse, daily use 01/14/2023   Drug overdose, accidental or unintentional, initial encounter 04/30/2021   Hypocalcemia 04/30/2021   Hyperglycemia 04/30/2021   Elevated AST (SGOT) 04/30/2021   Alcohol dependence (HCC) 04/30/2021   Aspiration into airway 04/30/2021   Depression

## 2023-01-19 NOTE — Group Note (Signed)
Group Topic: Recovery Basics  Group Date: 01/19/2023 Start Time: 2030 End Time: 2120 Facilitators: Rae Lips B  Department: [facility]  Number of Participants: 2  Group Focus: activities of daily living skills, coping skills, daily focus, depression, and feeling awareness/expression Treatment Modality:  Leisure Development Interventions utilized were leisure development, story telling, and support Purpose: enhance coping skills, express feelings, regain self-worth, reinforce self-care, and relapse prevention strategies  Name: Eric Nolan Date of Birth: [birth year]  MR: [id number]    Level of Participation: active Quality of Participation: attentive Interactions with others: gave feedback Mood/Affect: appropriate Triggers (if applicable): NA Cognition: coherent/clear Progress: Gaining insight Response: NA Plan: patient will be encouraged to keep going to groups.  Patients Problems:  Patient Active Problem List   Diagnosis Date Noted   Alcohol use disorder, severe, dependence (HCC) 01/17/2023   Alcohol withdrawal syndrome (HCC) 01/14/2023   Alcohol abuse, daily use 01/14/2023   Drug overdose, accidental or unintentional, initial encounter 04/30/2021   Hypocalcemia 04/30/2021   Hyperglycemia 04/30/2021   Elevated AST (SGOT) 04/30/2021   Alcohol dependence (HCC) 04/30/2021   Aspiration into airway 04/30/2021   Depression

## 2023-01-19 NOTE — ED Notes (Signed)
Patient observed/assessed at bedside lying in bed asleep. Patient alert and oriented to self and location. Patient denies pain.Complains of  anxiety. He denies A/V/H. He denies having any thoughts/plan of self harm and harm towards others. Fluid and snack offered. Patient states that appetite has been good throughout the day. Verbalizes no further complaints at this time. Will continue to monitor and support.

## 2023-01-19 NOTE — ED Provider Notes (Cosign Needed Addendum)
Behavioral Health Progress Note  Date and Time: 01/19/2023 7:54 AM Name: Eric Nolan MRN:  [id number]  Subjective:   The patient is a 42 year old male with limited past psychiatric history available for review.  He reports a history of opioid use disorder (which he now states has been in remission for approximately a year, while on Suboxone).  He also reports a history of alcohol use disorder, which has been severe over the past several years.  Patient denies any inpatient psychiatric hospitalizations or suicide attempts.  Patient presented to the [facility] behavioral urgent care requesting help with alcohol detox and residential rehab placement.  It has been discussed with the patient that residential rehab placement may be made difficult by the fact that he is on Suboxone and the fact that he has pending charges related to sexual offenses.  It appears that he has strong social support from his parents and his girlfriend and could engage in outpatient program if necessary.  Vitals within normal limits.  No acute events overnight.  No new labs.  Received twice daily Librium 25 mg for the past 2 days.  No refusals.  PRNs: Hydroxyzine 25 mg x 3, Ativan 1 mg x 1.  CIWAs: 10, 0, 0, 5.  On interview, patient said he is feeling "a lot better" and is "better every day."  Was in the milieu this morning, having a lively conversation with other patients.  Noticed some residual low-intensity tremor in bilateral upper extremities, but better than before.  Notes that he can read now.  Mom/girlfriend are bringing him some comic books later today.  Denied SI, HI, and AVH.  Noted some minor hallucinations last night, "a mouse or something" out of the corner of his eye.  Understands that what he saw was not real.  Confirms that he is taking Wellbutrin/Lexapro at home.  Notes that earlier in admission to [facility] last week, he had ideas of reference related to conversations that the TV was having about him  personally.  Even at that time, patient had the insight to know that these were not real.  No medication side effects.  Understands that he has PRNs for anxiety that he can ask for.  On treatment team interview, patient described some of his legal history and difficulties entering grad school.  No new complaints at this time.  Wants residential treatment.  Refuses to discontinue Suboxone -- will be referred out to places amenable to Suboxone use ([facility], [facility], etc.)  Was made aware that this may take some time, as it is Monday and there is a deluge of applications, from various places across the state.  Patient amenable.    Diagnosis:  Final diagnoses:  Alcohol use disorder  Alcohol withdrawal syndrome, uncomplicated (HCC)   Total Time spent with patient: 15 minutes  Past Psychiatric History: Substance-induced mood disorder treated with Wellbutrin and Lexapro.  No history of withdrawal. Past Medical History: as above Family History: none Family Psychiatric  History: none Social History: as above and per H and P  Additional Social History:  See H and P                  Sleep: Fair  Appetite:  Fair   Current Medications:  Current Facility-Administered Medications  Medication Dose Route Frequency Provider Last Rate Last Admin   acetaminophen (TYLENOL) tablet 650 mg  650 mg Oral [id number] Ajibola, Ene A, NP   650 mg at 01/18/23 2121   albuterol (VENTOLIN HFA) 108 (90  Base) MCG/ACT inhaler 2 puff  2 puff Inhalation Q6H PRN Ajibola, Ene A, NP       alum & mag hydroxide-simeth (MAALOX/MYLANTA) 200-200-20 MG/5ML suspension 30 mL  30 mL Oral Q4H PRN Ajibola, Ene A, NP   30 mL at 01/19/23 0646   buprenorphine-naloxone (SUBOXONE) 8-2 mg per SL tablet 1 tablet  1 tablet Sublingual BID Ajibola, Ene A, NP   1 tablet at 01/18/23 2121   buPROPion (WELLBUTRIN XL) 24 hr tablet 300 mg  300 mg Oral q morning Ajibola, Ene A, NP   300 mg at 01/18/23 0910   escitalopram (LEXAPRO) tablet  10 mg  10 mg Oral Daily Ajibola, Ene A, NP   10 mg at 01/18/23 0911   fluticasone (FLOVENT HFA) 110 MCG/ACT inhaler 2 puff  2 puff Inhalation BID Ajibola, Ene A, NP   2 puff at 01/18/23 2122   folic acid (FOLVITE) tablet 1 mg  1 mg Oral Daily Ajibola, Ene A, NP   1 mg at 01/18/23 0911   gabapentin (NEURONTIN) capsule 300 mg  300 mg Oral TID Carlyn Reichert, MD   300 mg at 01/18/23 2120   hydrOXYzine (ATARAX) tablet 25 mg  25 mg Oral Q6H PRN Ajibola, Ene A, NP   25 mg at 01/19/23 0648   loperamide (IMODIUM) capsule 2-4 mg  2-4 mg Oral PRN Ajibola, Ene A, NP       LORazepam (ATIVAN) tablet 1 mg  1 mg Oral Q6H PRN Ajibola, Ene A, NP   1 mg at 01/18/23 1412   multivitamin with minerals tablet 1 tablet  1 tablet Oral Daily Ajibola, Ene A, NP   1 tablet at 01/18/23 0910   ondansetron (ZOFRAN-ODT) disintegrating tablet 4 mg  4 mg Oral Q6H PRN Ajibola, Ene A, NP   4 mg at 01/17/23 1036   pantoprazole (PROTONIX) EC tablet 40 mg  40 mg Oral Daily Ajibola, Ene A, NP   40 mg at 01/18/23 0911   thiamine (VITAMIN B1) tablet 100 mg  100 mg Oral Daily Ajibola, Ene A, NP   100 mg at 01/18/23 0910   traZODone (DESYREL) tablet 50 mg  50 mg Oral QHS PRN Carlyn Reichert, MD   50 mg at 01/17/23 2114   Current Outpatient Medications  Medication Sig Dispense Refill   acetaminophen (TYLENOL) 325 MG tablet Take 2 tablets (650 mg total) by mouth 3 (three) times daily at 8am, 2pm and bedtime. 14 tablet 0   albuterol (VENTOLIN HFA) 108 (90 Base) MCG/ACT inhaler Inhale 2 puffs into the lungs every 6 (six) hours as needed for wheezing or shortness of breath. 8 g 2   alum & mag hydroxide-simeth (MAALOX/MYLANTA) 200-200-20 MG/5ML suspension Take 30 mLs by mouth every 4 (four) hours as needed for indigestion. 355 mL 0   buprenorphine-naloxone (SUBOXONE) 8-2 mg SUBL SL tablet Place 1 tablet under the tongue 2 (two) times daily.     buPROPion (WELLBUTRIN XL) 300 MG 24 hr tablet Take 300 mg by mouth every morning.     escitalopram  (LEXAPRO) 10 MG tablet Take 10 mg by mouth daily.     fluticasone (FLOVENT HFA) 110 MCG/ACT inhaler Inhale 2 puffs into the lungs 2 (two) times daily. (Patient taking differently: Inhale 2 puffs into the lungs 2 (two) times daily as needed (For shortness of breath).) 1 each 12   folic acid (FOLVITE) 1 MG tablet Take 1 tablet by mouth daily.     omeprazole (PRILOSEC) 20 MG capsule Take  2 capsules (40 mg total) by mouth daily. (Patient taking differently: Take 20 mg by mouth 2 (two) times daily before a meal.) 60 capsule 0   tadalafil (CIALIS) 10 MG tablet Take 10 mg by mouth daily as needed for erectile dysfunction.      Labs  Lab Results:  Admission on 01/14/2023, Discharged on 01/17/2023  Component Date Value Ref Range Status   WBC 01/14/2023 6.3  4.0 - 10.5 K/uL Final   RBC 01/14/2023 5.29  4.22 - 5.81 MIL/uL Final   Hemoglobin 01/14/2023 16.8  13.0 - 17.0 g/dL Final   HCT 29/56/2130 49.4  39.0 - 52.0 % Final   MCV 01/14/2023 93.4  80.0 - 100.0 fL Final   MCH 01/14/2023 31.8  26.0 - 34.0 pg Final   MCHC 01/14/2023 34.0  30.0 - 36.0 g/dL Final   RDW 86/57/8469 14.2  11.5 - 15.5 % Final   Platelets 01/14/2023 264  150 - 400 K/uL Final   nRBC 01/14/2023 0.0  0.0 - 0.2 % Final   Neutrophils Relative % 01/14/2023 67  % Final   Neutro Abs 01/14/2023 4.3  1.7 - 7.7 K/uL Final   Lymphocytes Relative 01/14/2023 20  % Final   Lymphs Abs 01/14/2023 1.2  0.7 - 4.0 K/uL Final   Monocytes Relative 01/14/2023 10  % Final   Monocytes Absolute 01/14/2023 0.6  0.1 - 1.0 K/uL Final   Eosinophils Relative 01/14/2023 1  % Final   Eosinophils Absolute 01/14/2023 0.0  0.0 - 0.5 K/uL Final   Basophils Relative 01/14/2023 1  % Final   Basophils Absolute 01/14/2023 0.1  0.0 - 0.1 K/uL Final   Immature Granulocytes 01/14/2023 1  % Final   Abs Immature Granulocytes 01/14/2023 0.03  0.00 - 0.07 K/uL Final   Performed at [facility], [address] [place], [place] [address]   Hgb A1c MFr Bld  01/14/2023 5.5  4.8 - 5.6 % Final   Comment: (NOTE) Pre diabetes:          5.7%-6.4%  Diabetes:              >6.4%  Glycemic control for   <7.0% adults with diabetes    Mean Plasma Glucose 01/14/2023 111.15  mg/dL Final   Performed at [facility], [address] [place], [place] [address]   Alcohol, Ethyl (B) 01/14/2023 152 (H)  <10 mg/dL Final   Comment: (NOTE) Lowest detectable limit for serum alcohol is 10 mg/dL.  For medical purposes only. Performed at [facility], [address] [place], [place] [address]    TSH 01/14/2023 2.594  0.350 - 4.500 uIU/mL Final   Comment: Performed by a 3rd Generation assay with a functional sensitivity of <=0.01 uIU/mL. Performed at [facility], [address] [place], [place] [address]    Prolactin 01/14/2023 24.6 (H)  3.9 - 22.7 ng/mL Final   Comment: (NOTE) Performed At: [facility] [address] [place], [place] [id number] Jolene Schimke MD QV:[id number]    Color, Urine 01/14/2023 YELLOW  YELLOW Final   APPearance 01/14/2023 CLEAR  CLEAR Final   Specific Gravity, Urine 01/14/2023 1.009  1.005 - 1.030 Final   pH 01/14/2023 8.0  5.0 - 8.0 Final   Glucose, UA 01/14/2023 NEGATIVE  NEGATIVE mg/dL Final   Hgb urine dipstick 01/14/2023 NEGATIVE  NEGATIVE Final   Bilirubin Urine 01/14/2023 NEGATIVE  NEGATIVE Final   Ketones, ur 01/14/2023 NEGATIVE  NEGATIVE mg/dL Final  Protein, ur 01/14/2023 NEGATIVE  NEGATIVE mg/dL Final   Nitrite 84/69/6295 NEGATIVE  NEGATIVE Final   Leukocytes,Ua 01/14/2023 NEGATIVE  NEGATIVE Final   Performed at [facility], [address] [place], [place] [address]   POC Amphetamine UR 01/14/2023 None Detected  NONE DETECTED (Cut Off Level 1000 ng/mL) Final   POC Secobarbital (BAR) 01/14/2023 None Detected  NONE DETECTED (Cut Off Level 300 ng/mL) Final   POC Buprenorphine (BUP) 01/14/2023 Positive (A)  NONE DETECTED (Cut Off Level 10 ng/mL) Final   POC Oxazepam (BZO) 01/14/2023  None Detected  NONE DETECTED (Cut Off Level 300 ng/mL) Final   POC Cocaine UR 01/14/2023 None Detected  NONE DETECTED (Cut Off Level 300 ng/mL) Final   POC Methamphetamine UR 01/14/2023 None Detected  NONE DETECTED (Cut Off Level 1000 ng/mL) Final   POC Morphine 01/14/2023 None Detected  NONE DETECTED (Cut Off Level 300 ng/mL) Final   POC Methadone UR 01/14/2023 None Detected  NONE DETECTED (Cut Off Level 300 ng/mL) Final   POC Oxycodone UR 01/14/2023 None Detected  NONE DETECTED (Cut Off Level 100 ng/mL) Final   POC Marijuana UR 01/14/2023 None Detected  NONE DETECTED (Cut Off Level 50 ng/mL) Final   Sodium 01/14/2023 140  135 - 145 mmol/L Final   Potassium 01/14/2023 4.0  3.5 - 5.1 mmol/L Final   Chloride 01/14/2023 95 (L)  98 - 111 mmol/L Final   CO2 01/14/2023 27  22 - 32 mmol/L Final   Glucose, Bld 01/14/2023 88  70 - 99 mg/dL Final   Glucose reference range applies only to samples taken after fasting for at least 8 hours.   BUN 01/14/2023 6  6 - 20 mg/dL Final   Creatinine, Ser 01/14/2023 0.71  0.61 - 1.24 mg/dL Final   Calcium 24/40/1027 9.6  8.9 - 10.3 mg/dL Final   Total Protein 25/36/6440 6.7  6.5 - 8.1 g/dL Final   Albumin 34/74/2595 4.0  3.5 - 5.0 g/dL Final   AST 63/87/5643 160 (H)  15 - 41 U/L Final   ALT 01/14/2023 71 (H)  0 - 44 U/L Final   Alkaline Phosphatase 01/14/2023 65  38 - 126 U/L Final   Total Bilirubin 01/14/2023 0.7  0.3 - 1.2 mg/dL Final   GFR, Estimated 01/14/2023 >60  >60 mL/min Final   Comment: (NOTE) Calculated using the CKD-EPI Creatinine Equation (2021)    Anion gap 01/14/2023 18 (H)  5 - 15 Final   Performed at [facility], [address], [place], [place] [address]   Cholesterol 01/14/2023 288 (H)  0 - 200 mg/dL Final   Triglycerides 88/41/6606 169 (H)  <150 mg/dL Final   HDL 30/16/0109 84  >40 mg/dL Final   Total CHOL/HDL Ratio 01/14/2023 3.4  RATIO Final   VLDL 01/14/2023 34  0 - 40 mg/dL Final   LDL Cholesterol 01/14/2023 170 (H)  0 - 99  mg/dL Final   Comment:        Total Cholesterol/HDL:CHD Risk Coronary Heart Disease Risk Table                     Men   Women  1/2 Average Risk   3.4   3.3  Average Risk       5.0   4.4  2 X Average Risk   9.6   7.1  3 X Average Risk  23.4   11.0        Use the calculated Patient  Ratio above and the CHD Risk Table to determine the patient's CHD Risk.        ATP III CLASSIFICATION (LDL):  <100     mg/dL   Optimal  093-235  mg/dL   Near or Above                    Optimal  130-159  mg/dL   Borderline  573-220  mg/dL   High  >254     mg/dL   Very High Performed at [facility], [address], [place], [place] [address]    Magnesium 01/14/2023 1.9  1.7 - 2.4 mg/dL Final   Performed at [facility], [address], [place], [place] [address]    Blood Alcohol level:  Lab Results  Component Value Date   ETH 152 (H) 01/14/2023   ETH 38 (H) 04/30/2021    Metabolic Disorder Labs: Lab Results  Component Value Date   HGBA1C 5.5 01/14/2023   MPG 111.15 01/14/2023   Lab Results  Component Value Date   PROLACTIN 24.6 (H) 01/14/2023   Lab Results  Component Value Date   CHOL 288 (H) 01/14/2023   TRIG 169 (H) 01/14/2023   HDL 84 01/14/2023   CHOLHDL 3.4 01/14/2023   VLDL 34 01/14/2023   LDLCALC 170 (H) 01/14/2023    Therapeutic Lab Levels: No results found for: "LITHIUM" No results found for: "VALPROATE" No results found for: "CBMZ"  Physical Findings   PHQ2-9    Flowsheet Row ED from 01/17/2023 in [facility]  PHQ-2 Total Score 2  PHQ-9 Total Score 8      Flowsheet Row ED from 01/17/2023 in [facility] ED from [facility]/14/2023 in [facility] ED from 11/17/2021 in [facility] Emergency Department at [facility]  C-SSRS RISK CATEGORY No Risk No Risk No Risk        Musculoskeletal  Strength & Muscle Tone: within normal limits Gait & Station: normal Patient  leans: N/A  Psychiatric Specialty Exam  Presentation General Appearance: Appropriate for Environment  Eye Contact:Fair  Speech:Clear and Coherent  Speech Volume:Normal  Handedness:-- (not assessed)   Mood and Affect  Mood:Euthymic  Affect:Congruent   Thought Process  Thought Processes:Coherent; Linear  Descriptions of Associations:Intact  Orientation:Full (Time, Place and Person)  Thought Content:Logical    Hallucinations:Hallucinations: None  Ideas of Reference:None  Suicidal Thoughts:Suicidal Thoughts: No  Homicidal Thoughts:Homicidal Thoughts: No   Sensorium  Memory:Immediate Fair; Recent Fair; Remote Fair  Judgment:Fair  Insight:Fair   Executive Functions  Concentration:Fair  Attention Span:Fair  Recall:Fair  Fund of Knowledge:Fair  Language:Fair   Psychomotor Activity  Psychomotor Activity:Psychomotor Activity: Normal   Assets  Assets:Communication Skills; Resilience   Sleep  Sleep:Sleep: Fair   Nutritional Assessment (For OBS and FBC admissions only) Has the patient had a weight loss or gain of 10 pounds or more in the last 3 months?: No Has the patient had a decrease in food intake/or appetite?: Yes Does the patient have dental problems?: No Does the patient have eating habits or behaviors that may be indicators of an eating disorder including binging or inducing vomiting?: No Has the patient recently lost weight without trying?: 0 Has the patient been eating poorly because of a decreased appetite?: 0 Malnutrition Screening Tool Score: 0    Physical Exam Constitutional:      Appearance: the patient is not toxic-appearing.  Pulmonary:     Effort: Pulmonary  effort is normal.  Neurological:     General: No focal deficit present.     Mental Status: the patient is alert and oriented to person, place, and time.   Review of Systems  Respiratory:  Negative for shortness of breath.   Cardiovascular:  Negative for chest pain.   Gastrointestinal:  Negative for abdominal pain, constipation, diarrhea, nausea and vomiting.  Neurological:  Negative for headaches.    BP 114/66   Pulse 75   Temp 98.1 F (36.7 C) (Oral)   Resp 18   SpO2 97%   Assessment and Plan:  Patient is at psychiatric baseline today.  Will begin curtailed Ativan taper today (twice daily 7/29, once 7/30) as patient does appear to be withdrawing and takes Wellbutrin which, in setting of alcohol withdrawal, can lower seizure threshold.  Librium discontinued in favor of Ativan due to heightened AST, ALT.  Last CIWAs: 10, 0, 0, 5.  Is amenable to staying here for a few more days to complete withdrawal.  Will reach out to residential treatment centers today.  #Alcohol use disorder, severe - Begin curtailed two day Ativan taper in light of already-given 2-day course of Librium.  Twice daily today 7/29, and once tomorrow.  We will continue to monitor. - CIWA with as needed Ativan - Continue gabapentin to 300 mg 3 times daily. - Gabapentin will be helpful as an outpatient medication for this patient due to his severe withdrawal symptoms. - Residential rehab placement if possible, two complicating factors include Suboxone dependence and pending sex offender charges. - Continue home Suboxone 8 mg twice daily, PDMP shows consistent fills for this medication.   #Substance-induced mood disorder - Continue home Wellbutrin 300 mg XL and home Lexapro 10 mg daily. - Patient reports longstanding use of these medications and states that he takes them every day.  #Disposition -Will reach out to residential treatment centers today.  Luiz Iron, MD PGY-1

## 2023-01-19 NOTE — ED Notes (Signed)
Patient in the bedroom sleeping. NAD.  Respirations are even and unlabored. Will continue to monitor for safety.

## 2023-01-19 NOTE — Progress Notes (Signed)
Pt's CIWA was 9 and medicated per order.

## 2023-01-19 NOTE — Group Note (Signed)
Group Topic: Fears and Unhealthy Coping Skills  Group Date: 01/18/2023 Start Time: 2000 End Time: 2030 Facilitators: Emmit Pomfret D, NT  Department: [facility]  Number of Participants: 5  Group Focus: acceptance Treatment Modality:  Psychoeducation Interventions utilized were support Purpose: increase insight  Name: Eric Nolan Date of Birth: 07-Jan-1981  MR: [id number]    Level of Participation: moderate Quality of Participation: attentive Interactions with others: gave feedback Mood/Affect: appropriate Triggers (if applicable): n/a Cognition: concrete Progress: Significant Response: n/a Plan: follow-up needed  Patients Problems:  Patient Active Problem List   Diagnosis Date Noted   Alcohol use disorder, severe, dependence (HCC) 01/17/2023   Alcohol withdrawal syndrome (HCC) 01/14/2023   Alcohol abuse, daily use 01/14/2023   Drug overdose, accidental or unintentional, initial encounter 04/30/2021   Hypocalcemia 04/30/2021   Hyperglycemia 04/30/2021   Elevated AST (SGOT) 04/30/2021   Alcohol dependence (HCC) 04/30/2021   Aspiration into airway 04/30/2021   Depression

## 2023-01-19 NOTE — Care Management (Signed)
Care Management   Patient has a probation officer (Shawn Norway [phone])  Writer left a HIPAA compliant voice mail message.

## 2023-01-20 ENCOUNTER — Emergency Department (HOSPITAL_COMMUNITY): Payer: MEDICAID

## 2023-01-20 ENCOUNTER — Inpatient Hospital Stay (HOSPITAL_COMMUNITY)
Admission: EM | Admit: 2023-01-20 | Discharge: 2023-01-22 | DRG: 896 | Disposition: A | Discharge status: Home / Self Care | Payer: MEDICAID | Attending: Internal Medicine | Admitting: Internal Medicine

## 2023-01-20 ENCOUNTER — Other Ambulatory Visit: Payer: Self-pay

## 2023-01-20 ENCOUNTER — Encounter (HOSPITAL_COMMUNITY): Payer: Self-pay

## 2023-01-20 DIAGNOSIS — R000 Tachycardia, unspecified: Secondary | ICD-10-CM | POA: Diagnosis present

## 2023-01-20 DIAGNOSIS — F329 Major depressive disorder, single episode, unspecified: Secondary | ICD-10-CM | POA: Diagnosis present

## 2023-01-20 DIAGNOSIS — F1121 Opioid dependence, in remission: Secondary | ICD-10-CM | POA: Diagnosis present

## 2023-01-20 DIAGNOSIS — Z813 Family history of other psychoactive substance abuse and dependence: Secondary | ICD-10-CM | POA: Diagnosis not present

## 2023-01-20 DIAGNOSIS — R112 Nausea with vomiting, unspecified: Secondary | ICD-10-CM | POA: Diagnosis not present

## 2023-01-20 DIAGNOSIS — Z87891 Personal history of nicotine dependence: Secondary | ICD-10-CM

## 2023-01-20 DIAGNOSIS — R7401 Elevation of levels of liver transaminase levels: Secondary | ICD-10-CM | POA: Diagnosis present

## 2023-01-20 DIAGNOSIS — F10229 Alcohol dependence with intoxication, unspecified: Secondary | ICD-10-CM | POA: Diagnosis present

## 2023-01-20 DIAGNOSIS — T17908A Unspecified foreign body in respiratory tract, part unspecified causing other injury, initial encounter: Secondary | ICD-10-CM | POA: Diagnosis present

## 2023-01-20 DIAGNOSIS — R748 Abnormal levels of other serum enzymes: Secondary | ICD-10-CM | POA: Diagnosis present

## 2023-01-20 DIAGNOSIS — F10230 Alcohol dependence with withdrawal, uncomplicated: Secondary | ICD-10-CM | POA: Diagnosis present

## 2023-01-20 DIAGNOSIS — F1910 Other psychoactive substance abuse, uncomplicated: Secondary | ICD-10-CM | POA: Diagnosis present

## 2023-01-20 DIAGNOSIS — F1020 Alcohol dependence, uncomplicated: Secondary | ICD-10-CM | POA: Diagnosis present

## 2023-01-20 DIAGNOSIS — J189 Pneumonia, unspecified organism: Principal | ICD-10-CM

## 2023-01-20 DIAGNOSIS — Z79899 Other long term (current) drug therapy: Secondary | ICD-10-CM

## 2023-01-20 DIAGNOSIS — X58XXXA Exposure to other specified factors, initial encounter: Secondary | ICD-10-CM | POA: Diagnosis present

## 2023-01-20 DIAGNOSIS — T17918A Gastric contents in respiratory tract, part unspecified causing other injury, initial encounter: Secondary | ICD-10-CM | POA: Diagnosis present

## 2023-01-20 DIAGNOSIS — Z7951 Long term (current) use of inhaled steroids: Secondary | ICD-10-CM

## 2023-01-20 DIAGNOSIS — F411 Generalized anxiety disorder: Secondary | ICD-10-CM | POA: Diagnosis present

## 2023-01-20 DIAGNOSIS — F32A Depression, unspecified: Secondary | ICD-10-CM | POA: Diagnosis present

## 2023-01-20 DIAGNOSIS — J9601 Acute respiratory failure with hypoxia: Secondary | ICD-10-CM | POA: Diagnosis present

## 2023-01-20 LAB — CBC
HCT: 46.4 % (ref 39.0–52.0)
Hemoglobin: 15.5 g/dL (ref 13.0–17.0)
MCH: 32.4 pg (ref 26.0–34.0)
MCHC: 33.4 g/dL (ref 30.0–36.0)
MCV: 96.9 fL (ref 80.0–100.0)
Platelets: 209 10*3/uL (ref 150–400)
RBC: 4.79 MIL/uL (ref 4.22–5.81)
RDW: 13.8 % (ref 11.5–15.5)
WBC: 9.3 10*3/uL (ref 4.0–10.5)
nRBC: 0 % (ref 0.0–0.2)

## 2023-01-20 LAB — URINALYSIS, W/ REFLEX TO CULTURE (INFECTION SUSPECTED)
Bacteria, UA: NONE SEEN
Bilirubin Urine: NEGATIVE
Glucose, UA: NEGATIVE mg/dL
Hgb urine dipstick: NEGATIVE
Ketones, ur: NEGATIVE mg/dL
Leukocytes,Ua: NEGATIVE
Nitrite: NEGATIVE
Protein, ur: NEGATIVE mg/dL
Specific Gravity, Urine: 1.017 (ref 1.005–1.030)
pH: 7 (ref 5.0–8.0)

## 2023-01-20 LAB — RAPID URINE DRUG SCREEN, HOSP PERFORMED
Amphetamines: NOT DETECTED
Barbiturates: NOT DETECTED
Benzodiazepines: POSITIVE — AB
Cocaine: NOT DETECTED
Opiates: NOT DETECTED
Tetrahydrocannabinol: NOT DETECTED

## 2023-01-20 LAB — I-STAT CG4 LACTIC ACID, ED
Lactic Acid, Venous: 2.4 mmol/L (ref 0.5–1.9)
Lactic Acid, Venous: 3.1 mmol/L (ref 0.5–1.9)

## 2023-01-20 LAB — PROCALCITONIN: Procalcitonin: 0.14 ng/mL

## 2023-01-20 LAB — COMPREHENSIVE METABOLIC PANEL
ALT: 45 U/L — ABNORMAL HIGH (ref 0–44)
AST: 54 U/L — ABNORMAL HIGH (ref 15–41)
Albumin: 3.6 g/dL (ref 3.5–5.0)
Alkaline Phosphatase: 51 U/L (ref 38–126)
Anion gap: 13 (ref 5–15)
BUN: 6 mg/dL (ref 6–20)
CO2: 26 mmol/L (ref 22–32)
Calcium: 9.1 mg/dL (ref 8.9–10.3)
Chloride: 98 mmol/L (ref 98–111)
Creatinine, Ser: 0.93 mg/dL (ref 0.61–1.24)
GFR, Estimated: >60 mL/min (ref >60)
Glucose, Bld: 135 mg/dL — ABNORMAL HIGH (ref 70–99)
Potassium: 4.3 mmol/L (ref 3.5–5.1)
Sodium: 137 mmol/L (ref 135–145)
Total Bilirubin: 0.7 mg/dL (ref 0.3–1.2)
Total Protein: 6.1 g/dL — ABNORMAL LOW (ref 6.5–8.1)

## 2023-01-20 LAB — ETHANOL: Alcohol, Ethyl (B): <10 mg/dL (ref <10)

## 2023-01-20 LAB — CULTURE, BLOOD (ROUTINE X 2)
Culture: NO GROWTH
Culture: NO GROWTH
Special Requests: ADEQUATE
Special Requests: ADEQUATE

## 2023-01-20 LAB — TROPONIN I (HIGH SENSITIVITY)
Troponin I (High Sensitivity): 5 ng/L (ref <18)
Troponin I (High Sensitivity): 6 ng/L (ref <18)

## 2023-01-20 LAB — BRAIN NATRIURETIC PEPTIDE: B Natriuretic Peptide: 17.6 pg/mL (ref 0.0–100.0)

## 2023-01-20 MED ORDER — SODIUM CHLORIDE 0.9 % IV SOLN: INTRAVENOUS | Status: DC

## 2023-01-20 MED ORDER — SODIUM CHLORIDE 0.9 % IV SOLN: 1.0000 g | INTRAVENOUS | Status: DC

## 2023-01-20 MED ORDER — PANTOPRAZOLE SODIUM 40 MG PO TBEC
40.0000 mg | DELAYED_RELEASE_TABLET | Freq: Every day | ORAL | Status: DC
  Administered 2023-01-20 – 2023-01-22 (×3): 40 mg via ORAL
  Filled 2023-01-20 (×3): qty 1

## 2023-01-20 MED ORDER — METRONIDAZOLE 500 MG/100ML IV SOLN
500.0000 mg | Freq: Once | INTRAVENOUS | Status: AC
  Administered 2023-01-20: 500 mg via INTRAVENOUS
  Filled 2023-01-20: qty 100

## 2023-01-20 MED ORDER — IOHEXOL 350 MG/ML SOLN
75.0000 mL | Freq: Once | INTRAVENOUS | Status: AC | PRN
  Administered 2023-01-20: 75 mL via INTRAVENOUS

## 2023-01-20 MED ORDER — GABAPENTIN 300 MG PO CAPS
300.0000 mg | ORAL_CAPSULE | Freq: Three times a day (TID) | ORAL | Status: DC
  Administered 2023-01-20: 300 mg via ORAL
  Filled 2023-01-20: qty 1

## 2023-01-20 MED ORDER — ACETAMINOPHEN 650 MG RE SUPP: 650.0000 mg | Freq: Four times a day (QID) | RECTAL | Status: DC | PRN

## 2023-01-20 MED ORDER — BUPRENORPHINE HCL-NALOXONE HCL 8-2 MG SL SUBL
1.0000 | SUBLINGUAL_TABLET | Freq: Two times a day (BID) | SUBLINGUAL | Status: DC
  Administered 2023-01-20 – 2023-01-22 (×4): 1 via SUBLINGUAL
  Filled 2023-01-20 (×5): qty 1

## 2023-01-20 MED ORDER — HYDRALAZINE HCL 20 MG/ML IJ SOLN: 5.0000 mg | INTRAMUSCULAR | Status: DC | PRN

## 2023-01-20 MED ORDER — ADULT MULTIVITAMIN W/MINERALS CH
1.0000 | ORAL_TABLET | Freq: Every day | ORAL | Status: DC
  Administered 2023-01-20 – 2023-01-22 (×3): 1 via ORAL
  Filled 2023-01-20 (×3): qty 1

## 2023-01-20 MED ORDER — POLYETHYLENE GLYCOL 3350 17 G PO PACK: 17.0000 g | PACK | Freq: Every day | ORAL | Status: DC | PRN

## 2023-01-20 MED ORDER — SODIUM CHLORIDE 0.9 % IV SOLN
500.0000 mg | Freq: Every day | INTRAVENOUS | Status: DC
  Administered 2023-01-21 – 2023-01-22 (×2): 500 mg via INTRAVENOUS
  Filled 2023-01-20 (×2): qty 5

## 2023-01-20 MED ORDER — DOCUSATE SODIUM 100 MG PO CAPS
100.0000 mg | ORAL_CAPSULE | Freq: Two times a day (BID) | ORAL | Status: DC
  Administered 2023-01-20 – 2023-01-22 (×3): 100 mg via ORAL
  Filled 2023-01-20 (×3): qty 1

## 2023-01-20 MED ORDER — ACETAMINOPHEN 325 MG PO TABS
650.0000 mg | ORAL_TABLET | Freq: Four times a day (QID) | ORAL | Status: DC | PRN
  Administered 2023-01-21: 650 mg via ORAL
  Filled 2023-01-20: qty 2

## 2023-01-20 MED ORDER — GUAIFENESIN ER 600 MG PO TB12: 600.0000 mg | ORAL_TABLET | Freq: Two times a day (BID) | ORAL | Status: DC | PRN

## 2023-01-20 MED ORDER — ACETAMINOPHEN 325 MG PO TABS: 650.0000 mg | ORAL_TABLET | Freq: Four times a day (QID) | ORAL | Status: DC | PRN

## 2023-01-20 MED ORDER — SODIUM CHLORIDE 0.9 % IV SOLN
2.0000 g | Freq: Once | INTRAVENOUS | Status: AC
  Administered 2023-01-20: 2 g via INTRAVENOUS
  Filled 2023-01-20: qty 20

## 2023-01-20 MED ORDER — METRONIDAZOLE 500 MG/100ML IV SOLN: 500.0000 mg | Freq: Two times a day (BID) | INTRAVENOUS | Status: DC

## 2023-01-20 MED ORDER — SODIUM CHLORIDE 0.9% FLUSH
3.0000 mL | Freq: Two times a day (BID) | INTRAVENOUS | Status: DC
  Administered 2023-01-20 – 2023-01-22 (×3): 3 mL via INTRAVENOUS

## 2023-01-20 MED ORDER — ONDANSETRON 4 MG PO TBDP: 4.0000 mg | ORAL_TABLET | Freq: Three times a day (TID) | ORAL | Status: DC | PRN

## 2023-01-20 MED ORDER — ONDANSETRON HCL 4 MG PO TABS: 4.0000 mg | ORAL_TABLET | Freq: Four times a day (QID) | ORAL | Status: DC | PRN

## 2023-01-20 MED ORDER — ALBUTEROL SULFATE (2.5 MG/3ML) 0.083% IN NEBU: 2.5000 mg | INHALATION_SOLUTION | Freq: Four times a day (QID) | RESPIRATORY_TRACT | Status: DC | PRN

## 2023-01-20 MED ORDER — THIAMINE HCL 100 MG/ML IJ SOLN: 100.0000 mg | Freq: Every day | INTRAMUSCULAR | Status: DC

## 2023-01-20 MED ORDER — SODIUM CHLORIDE 0.9 % IV SOLN
500.0000 mg | Freq: Once | INTRAVENOUS | Status: AC
  Administered 2023-01-20: 500 mg via INTRAVENOUS
  Filled 2023-01-20: qty 5

## 2023-01-20 MED ORDER — FOLIC ACID 1 MG PO TABS
1.0000 mg | ORAL_TABLET | Freq: Every day | ORAL | Status: DC
  Administered 2023-01-20 – 2023-01-22 (×3): 1 mg via ORAL
  Filled 2023-01-20 (×3): qty 1

## 2023-01-20 MED ORDER — BISACODYL 5 MG PO TBEC: 5.0000 mg | DELAYED_RELEASE_TABLET | Freq: Every day | ORAL | Status: DC | PRN

## 2023-01-20 MED ORDER — LORAZEPAM 1 MG PO TABS
1.0000 mg | ORAL_TABLET | ORAL | Status: DC | PRN
  Administered 2023-01-20 – 2023-01-22 (×6): 1 mg via ORAL
  Administered 2023-01-22: 2 mg via ORAL
  Filled 2023-01-20 (×4): qty 1
  Filled 2023-01-20: qty 2
  Filled 2023-01-20: qty 1
  Filled 2023-01-20: qty 2

## 2023-01-20 MED ORDER — LACTATED RINGERS IV BOLUS
1000.0000 mL | Freq: Once | INTRAVENOUS | Status: AC
  Administered 2023-01-20: 1000 mL via INTRAVENOUS

## 2023-01-20 MED ORDER — ENOXAPARIN SODIUM 40 MG/0.4ML IJ SOSY
40.0000 mg | PREFILLED_SYRINGE | INTRAMUSCULAR | Status: DC
  Administered 2023-01-20 – 2023-01-22 (×3): 40 mg via SUBCUTANEOUS
  Filled 2023-01-20 (×3): qty 0.4

## 2023-01-20 MED ORDER — THIAMINE MONONITRATE 100 MG PO TABS
100.0000 mg | ORAL_TABLET | Freq: Every day | ORAL | Status: DC
  Administered 2023-01-20 – 2023-01-22 (×3): 100 mg via ORAL
  Filled 2023-01-20 (×3): qty 1

## 2023-01-20 MED ORDER — ONDANSETRON HCL 4 MG/2ML IJ SOLN: 4.0000 mg | Freq: Four times a day (QID) | INTRAMUSCULAR | Status: DC | PRN

## 2023-01-20 MED ORDER — ESCITALOPRAM OXALATE 10 MG PO TABS
10.0000 mg | ORAL_TABLET | Freq: Every day | ORAL | Status: DC
  Administered 2023-01-20 – 2023-01-22 (×3): 10 mg via ORAL
  Filled 2023-01-20 (×3): qty 1

## 2023-01-20 MED ORDER — BUPROPION HCL ER (XL) 150 MG PO TB24
300.0000 mg | ORAL_TABLET | Freq: Every morning | ORAL | Status: DC
  Administered 2023-01-20 – 2023-01-22 (×3): 300 mg via ORAL
  Filled 2023-01-20 (×2): qty 2
  Filled 2023-01-20: qty 1

## 2023-01-20 NOTE — ED Provider Notes (Signed)
Received report patient was vomiting with elevated vital signs.  HPI: Patient reports a chronic history of alcohol abuse.  States that he is currently drinking a half gallon Vodka in 3 days x 3 years. He states that at that time he experienced withdrawal symptoms of sweats, shakes, vomiting and panic attacks.    On arrival to [facility], patient was in his room, seated upright, shaking, anxious, and hyperventilating/dry heaving. Staff was present and expressed concern patient might have aspirated. Patient on CIWA, last result 6 at 1818 pm 01/19/23.  Patient appeared to be in acute distress, and noted to be visibly shaking with elevated BP 165/150, HR 150, o2 sat 79%. Patient was placed on 2 L of O2/[place] by staff.  Recommend transfer patient to the [facility] for medical clearance.   Dr Allyne Gee at [facility] notified and the provider has agreed to accept the patient.    Patient may return to the [facility] to continue his detox treatment when medically stable. EMTALA completed. Current VS collected manually by staff BP 124/76, P97, O2 sat 91% 2L/[place] R22.

## 2023-01-20 NOTE — Progress Notes (Signed)
Carryover admission to the Day Admitter.  I discussed this case with the EDP, Dr. Blinda Leatherwood.  Per these discussions:   This is a 42 year old male with history of chronic alcohol abuse who is being admitted with acute hypoxic respiratory failure in the setting of right-sided pneumonia, potentially aspiration in nature.   He has been at behavioral health for the last 4 days undergoing alcohol detoxification and was felt to be through the majority of his alcohol withdrawal when he started to experience nausea/vomiting this evening, followed by new shortness of breath, prompting him to be brought to [facility] emergency department for evaluation and management thereof.  He complains of new onset cough and mild shortness of breath.  He acknowledges some mild chest discomfort, but he states that this is chronic for him with very similar chest discomfort over the last several months, without any change in distribution, quality, or intensity over the last day.  In the ED, his initial oxygen saturations were in the mid 80s on room air, [place] improving into the mid 90s on 4 L nasal cannula.  Is also noted to be mildly tachycardic and tachypneic, but with normotensive blood pressures.   Initial lactate mildly elevated 2.4.  Chest x-ray was suggestive of right lower lobe infiltrate, while CT chest also showed evidence suggestive of right lower lobe pneumonia, potentially aspiration in nature.    He was started in the ED on azithromycin, Rocephin, as well as Flagyl for community-acquired pneumonia, with potential component of aspiration. Blood cultures x 2 were collected prior to initiation of these antibiotics.  I have placed an order for inpatient admission for further evaluation and management of the above.  I have placed some additional preliminary admit orders via the adult multi-morbid admission order set. I have also ordered continuation of azithromycin, Rocephin, and IV Flagyl.  I will defer need for  additional CIWA protocol to the admitting hospitalist.    Newton Pigg, DO Hospitalist

## 2023-01-20 NOTE — ED Notes (Signed)
Rn went to administer patient Suboxone. Patient stated he took is Suboxone and he was told that he cannot have his medication with him. The patient gave the medication to his mother.

## 2023-01-20 NOTE — ED Notes (Signed)
Pt sent to [facility]

## 2023-01-20 NOTE — ED Triage Notes (Addendum)
Patient arrives from [facility] for alcohol detox. Patient has been at [facility] since Wednesday. Today the patient began having tremors, nausea, and low oxygen saturation. Patient also complains of chest pain for several days. Patient arrives on 4L with O2 at 91%. EMS [address] of [facility]  for chest pain. [facility] gave 1mg  ativan and 4 mg zofran PO prior to arrival. Patient reports dizziness when standing.

## 2023-01-20 NOTE — ED Notes (Signed)
Report given to [facility] RN Ccharge@[facility] 

## 2023-01-20 NOTE — Discharge Instructions (Signed)

## 2023-01-20 NOTE — H&P (Signed)
History and Physical    Patient: Eric Nolan ZOX:[id number] DOB: [birth year] DOA: 01/20/2023 DOS: the patient was seen and examined on 01/20/2023 PCP: Diamantina Providence, FNP  Patient coming from: Home - lives alternately with parents and girlfriend; NOK: Girlfriend, Marlowe Aschoff, [phone]   Chief Complaint: ETOH withdrawal  HPI: Eric Nolan is a 42 y.o. male with medical history significant of depression and chronic ETOH dependence presenting from [facility] for ETOH detox.  He has been there since 7/24 but was experiencing nausea, tremors, and low O2 saturations.  He reports prior h/o snorting oxy, has been on suboxone.  Stopped that but recently ramping up ETOH use.  He went to [facility] 6 days ago (7/24) and has been detoxing there.  Developed acute n/v and then SOB, cough.  Currently on [place] O2 and feeling some better but reports he is very tremulous and needs all of the detox medications (he is quite specific in naming gabapentin, suboxone, Librium, Ativan). He reports having not ever been to rehab before.    ER Course:  Carryover, per Dr. Arlean Hopping:  Acute hypoxic respiratory failure in the setting of right-sided pneumonia, potentially aspiration in nature. He has been at behavioral health for the last 4 days undergoing alcohol detoxification and was felt to be through the majority of his alcohol withdrawal when he started to experience nausea/vomiting this evening, followed by new shortness of breath and cough.   Mild chest discomfort, chronic and stable.     In the ED, O2 in mid 80s on room air, improving into the mid 90s on 4 L nasal cannula.  Lactate 2.4.  CXR with RLL infiltrate, chest CT also with RLL PNA, ?aspiration.  Given Rocephin, Azithromycin, Flagyl.  Blood cultures pending.     Review of Systems: As mentioned in the history of present illness. All other systems reviewed and are negative. Past Medical History:  Diagnosis Date   Depression    Gallstones     History reviewed. No pertinent surgical history. Social History:  reports that he quit smoking about 17 years ago. His smoking use included cigarettes. He started smoking about 22 years ago. He has a 5.2 pack-year smoking history. He has never used smokeless tobacco. He reports current alcohol use of about 70.0 standard drinks of alcohol per week. He reports current drug use.  No Known Allergies  Family History  Problem Relation Age of Onset   Valvular heart disease Father    Gout Father    Drug abuse Brother    Alzheimer's disease Maternal Grandmother    Heart attack Paternal Grandfather    Stroke Paternal Grandfather     Prior to Admission medications   Medication Sig Start Date End Date Taking? Authorizing Provider  acetaminophen (TYLENOL) 325 MG tablet Take 2 tablets (650 mg total) by mouth 3 (three) times daily at 8am, 2pm and bedtime. 01/17/23   Ajibola, Gerrianne Scale A, NP  albuterol (VENTOLIN HFA) 108 (90 Base) MCG/ACT inhaler Inhale 2 puffs into the lungs every 6 (six) hours as needed for wheezing or shortness of breath. 01/17/23   Ajibola, Ene A, NP  alum & mag hydroxide-simeth (MAALOX/MYLANTA) 200-200-20 MG/5ML suspension Take 30 mLs by mouth every 4 (four) hours as needed for indigestion. 01/17/23   Ajibola, Ene A, NP  buprenorphine-naloxone (SUBOXONE) 8-2 mg SUBL SL tablet Place 1 tablet under the tongue 2 (two) times daily.    [provider]  buPROPion (WELLBUTRIN XL) 300 MG 24 hr tablet Take 300 mg by  mouth every morning. 04/14/21   [provider]  escitalopram (LEXAPRO) 10 MG tablet Take 10 mg by mouth daily.    [provider]  fluticasone (FLOVENT HFA) 110 MCG/ACT inhaler Inhale 2 puffs into the lungs 2 (two) times daily. Patient taking differently: Inhale 2 puffs into the lungs 2 (two) times daily as needed (For shortness of breath). 09/04/21   Martina Sinner, MD  folic acid (FOLVITE) 1 MG tablet Take 1 tablet by mouth daily.    [provider]  omeprazole (PRILOSEC) 20 MG capsule Take 2 capsules (40 mg total) by mouth daily. Patient taking differently: Take 20 mg by mouth 2 (two) times daily before a meal. 11/17/21 01/14/23  Loeffler, Finis Bud, PA-C  tadalafil (CIALIS) 10 MG tablet Take 10 mg by mouth daily as needed for erectile dysfunction.    [provider]    Physical Exam: Vitals:   01/20/23 1255 01/20/23 1507 01/20/23 1522 01/20/23 1845  BP:  106/81 124/89   Pulse:  85 98 89  Resp:   (!) 22 20  Temp: 98.9 F (37.2 C)  98.1 F (36.7 C)   TempSrc: Oral  Oral   SpO2:   (!) 5% 96%  Weight:      Height:       General:  Appears calm and comfortable and is in NAD, tremulous, on [place] O2 Eyes:  EOMI, normal lids, iris ENT:  grossly normal hearing, lips & tongue, mmm Neck:  no LAD, masses or thyromegaly Cardiovascular:  RRR, no m/r/g. No LE edema.  Respiratory:   CTA bilaterally with no wheezes/rales/rhonchi.  Normal respiratory effort. Abdomen:  soft, NT, ND Skin:  no rash or induration seen on limited exam Musculoskeletal:  grossly normal tone BUE/BLE, good ROM, no bony abnormality Psychiatric:  grossly normal mood and affect, speech fluent and appropriate, AOx3 Neurologic:  CN 2-12 grossly intact, moves all extremities in coordinated fashion    Radiological Exams on Admission: Independently reviewed - see discussion in A/P where applicable  CT Angio Chest Pulmonary Embolism (PE) W or WO Contrast  Result Date: 01/20/2023 CLINICAL DATA:  Evaluate for pulmonary embolism. High probability. Alcohol detox. EXAM: CT ANGIOGRAPHY CHEST WITH CONTRAST TECHNIQUE: Multidetector CT imaging of the chest was performed using the standard protocol during bolus administration of intravenous contrast. Multiplanar CT image reconstructions and MIPs were obtained to evaluate the vascular anatomy. RADIATION DOSE REDUCTION: This exam was performed according to the departmental dose-optimization program which includes automated  exposure control, adjustment of the mA and/or kV according to patient size and/or use of iterative reconstruction technique. CONTRAST:  75mL OMNIPAQUE IOHEXOL 350 MG/ML SOLN COMPARISON:  None Available. FINDINGS: Cardiovascular: Satisfactory opacification of the pulmonary arteries to the segmental level. No evidence of pulmonary embolism. Normal heart size. No pericardial effusion. Mediastinum/Nodes: No enlarged mediastinal, hilar, or axillary lymph nodes. Thyroid gland, trachea, and esophagus demonstrate no significant findings. Lungs/Pleura: No pleural effusion or pneumothorax. Partial atelectasis of the anterior basal right middle lobe. Multifocal ground-glass and airspace scratch there is diffuse, patchy ground-glass and airspace densities throughout the right lung which is concerning for multifocal infection versus sequelae of aspiration. Left lung appears clear. Upper Abdomen: Small hiatal hernia. Diffuse hepatic steatosis. There is hypertrophy of the caudate lobe and lateral segment of left hepatic lobe. Status post cholecystectomy. Musculoskeletal: No acute or suspicious osseous findings. Review of the MIP images confirms the above findings. IMPRESSION: 1. No pulmonary embolism. 2. Diffuse, patchy ground-glass and airspace densities throughout the  right lung which is concerning for multifocal infection versus sequelae of aspiration. 3. Diffuse hepatic steatosis. There is hypertrophy of the caudate lobe and lateral segment of left hepatic lobe. Findings are suggestive of cirrhosis. Electronically Signed   By: Signa Kell M.D.   On: 01/20/2023 06:40   DG Chest 2 View  Result Date: 01/20/2023 CLINICAL DATA:  Chest pain.  Alcohol detox EXAM: CHEST - 2 VIEW COMPARISON:  11/17/2021 FINDINGS: Volume loss and hazy density at the right base. No edema, effusion, or pneumothorax. Normal heart size. IMPRESSION: Right base infiltrate, usually pneumonia. Followup PA and lateral chest X-ray is recommended in 3-4  weeks following trial of antibiotic therapy to ensure resolution. Electronically Signed   By: Tiburcio Pea M.D.   On: 01/20/2023 06:17    EKG: Independently reviewed.  Sinus tachycardia with rate 101; no evidence of acute ischemia   Labs on Admission: I have personally reviewed the available labs and imaging studies at the time of the admission.  Pertinent labs:    Glucose 135 AST 54/ALT 45; 160/71 on 7/24 BNP 17.6 HS troponin 5, 6 Lactate 2.4, 3.1 Normal CBC UA WNL UDS + BZD ETOH <10 Blood cultures pending   Assessment and Plan: Principal Problem:   Acute hypoxic respiratory failure (HCC) Active Problems:   Depression   Aspiration into airway   Alcohol use disorder, severe, dependence (HCC)   Polysubstance abuse (HCC)    Acute hypoxic respiratory failure associated with aspiration -Patient with acute onset of n/v and apparent aspiration overnight -O2 sat documented as low as 79% -Imaging is c/w aspiration PNA vs. CAP -He does not show obvious evidence of infection so it is possible that this is an aspiration pneumonitis rather than a true PNA -Will admit to progressive for ongoing evaluation/treatment -Will cover with Rocephin/Azithromycin for now; he also received a dose of metronidazole but this was not continued  -Lower respiratory tract procalcitonin level is >0.5, suggestive of possible lack of infection -Trend lactate to ensuring that this is clearing  Polysubstance abuse -Prior h/o opiate dependence (snorted oxy), on suboxone; he has taken his home suboxone in the ER and has ongoing behaviors of abuse -With 6 days of treatment for ETOH withdrawal, he is unlikely to be experiencing ongoing withdrawal at this time -Will order CIWA with prn PO Ativan only (was asking for both Ativan and Librium) He was started on gabapentin 300 mg TID at [facility], will continue -He may have difficulty with placement per [facility] note given suboxone as well as pending sex offender  charges -Reviewed in PDMP database, appears to be taking suboxone regularly; however, he appears to have moved from opiate dependence despite suboxone to ETOH dependence and so may benefit from eventual taper off suboxone and more intensive/long-term residential rehab program  Substance-induced mood d/o -Continue Wellbutrin, lexapro     Advance Care Planning:   Code Status: Full Code   Consults: TOC team  DVT Prophylaxis: Lovenox  Family Communication: None present; he declined to have me contact family at the time of admission  Severity of Illness: The appropriate patient status for this patient is INPATIENT. Inpatient status is judged to be reasonable and necessary in order to provide the required intensity of service to ensure the patient's safety. The patient's presenting symptoms, physical exam findings, and initial radiographic and laboratory data in the context of their chronic comorbidities is felt to place them at high risk for further clinical deterioration. Furthermore, it is not anticipated that the patient will  be medically stable for discharge from the hospital within 2 midnights of admission.   * I certify that at the point of admission it is my clinical judgment that the patient will require inpatient hospital care spanning beyond 2 midnights from the point of admission due to high intensity of service, high risk for further deterioration and high frequency of surveillance required.*  Author: Jonah Blue, MD 01/20/2023 7:05 PM  For on call review [email or web address].

## 2023-01-20 NOTE — ED Notes (Signed)
Pt on Oxygen 2 liters per minute nasal cannula

## 2023-01-20 NOTE — ED Notes (Signed)
ED TO INPATIENT HANDOFF REPORT  ED Nurse Name and Phone #: [phone]  S Name/Age/Gender Eric Nolan 42 y.o. male Room/Bed: 002C/002C  Code Status   Code Status: Full Code  Home/SNF/Other Home Patient oriented to: self, place, time, and situation Is this baseline? 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 Patient has been at [facility] since Wednesday. Today the patient began having tremors, nausea, and low oxygen saturation. Patient also complains of chest pain for several days. Patient arrives on 4L with O2 at 91%. EMS [address] [facility]  for chest pain. [facility] gave 1mg  ativan and 4 mg zofran PO prior to arrival. Patient reports dizziness when standing.    Allergies No Known Allergies  Level of Care/Admitting Diagnosis ED Disposition     ED Disposition  Admit   Condition  --   Comment  Hospital Area: [facility] [[id number]]  Level of Care: Progressive [102]  Admit to Progressive based on following criteria: MULTISYSTEM THREATS such as stable sepsis, metabolic/electrolyte imbalance with or without encephalopathy that is responding to early treatment.  May admit patient to [facility] or [facility] if equivalent level of care is available:: No  Covid Evaluation: Asymptomatic - no recent exposure (last 10 days) testing not required  Diagnosis: Acute hypoxic respiratory failure [facility]) [[id number]]  Admitting Physician: Angie Fava [[id number]]  Attending Physician: Angie Fava [[id number]]  Certification:: I certify this patient will need inpatient services for at least 2 midnights  Estimated Length of Stay: 2          B Medical/Surgery History Past Medical History:  Diagnosis Date   Depression    Gallstones    History reviewed. No pertinent surgical history.   A IV Location/Drains/Wounds Patient Lines/Drains/Airways  Status     Active Line/Drains/Airways     Name Placement date Placement time Site Days   Peripheral IV 01/20/23 18 G Left Antecubital 01/20/23  0432  Antecubital  less than 1   Peripheral IV 01/20/23 20 G Right Antecubital 01/20/23  0654  Antecubital  less than 1            Intake/Output Last 24 hours No intake or output data in the 24 hours ending 01/20/23 1648  Labs/Imaging Results for orders placed or performed during the hospital encounter of 01/20/23 (from the past 48 hour(s))  Comprehensive metabolic panel     Status: Abnormal   Collection Time: 01/20/23  4:43 AM  Result Value Ref Range   Sodium 137 135 - 145 mmol/L   Potassium 4.3 3.5 - 5.1 mmol/L    Comment: HEMOLYSIS AT THIS LEVEL MAY AFFECT RESULT   Chloride 98 98 - 111 mmol/L   CO2 26 22 - 32 mmol/L   Glucose, Bld 135 (H) 70 - 99 mg/dL    Comment: Glucose reference range applies only to samples taken after fasting for at least 8 hours.   BUN 6 6 - 20 mg/dL   Creatinine, Ser 7.84 0.61 - 1.24 mg/dL   Calcium 9.1 8.9 - 69.6 mg/dL   Total Protein 6.1 (L) 6.5 - 8.1 g/dL   Albumin 3.6 3.5 - 5.0 g/dL   AST 54 (H) 15 - 41 U/L    Comment: HEMOLYSIS AT THIS LEVEL MAY AFFECT RESULT   ALT 45 (H) 0 - 44 U/L    Comment: HEMOLYSIS AT THIS LEVEL MAY AFFECT RESULT  Alkaline Phosphatase 51 38 - 126 U/L   Total Bilirubin 0.7 0.3 - 1.2 mg/dL    Comment: HEMOLYSIS AT THIS LEVEL MAY AFFECT RESULT   GFR, Estimated >60 >60 mL/min    Comment: (NOTE) Calculated using the CKD-EPI Creatinine Equation (2021)    Anion gap 13 5 - 15    Comment: Performed at [facility], [address], [place], [place] [address]  Ethanol     Status: None   Collection Time: 01/20/23  4:43 AM  Result Value Ref Range   Alcohol, Ethyl (B) <10 <10 mg/dL    Comment: (NOTE) Lowest detectable limit for serum alcohol is 10 mg/dL.  For medical purposes only. Performed at [facility], [address] [place], [place] [address]   cbc      Status: None   Collection Time: 01/20/23  4:43 AM  Result Value Ref Range   WBC 9.3 4.0 - 10.5 K/uL   RBC 4.79 4.22 - 5.81 MIL/uL   Hemoglobin 15.5 13.0 - 17.0 g/dL   HCT 96.2 95.2 - 84.1 %   MCV 96.9 80.0 - 100.0 fL   MCH 32.4 26.0 - 34.0 pg   MCHC 33.4 30.0 - 36.0 g/dL   RDW 32.4 40.1 - 02.7 %   Platelets 209 150 - 400 K/uL   nRBC 0.0 0.0 - 0.2 %    Comment: Performed at [facility], [address] [place], [place] [address]  Troponin I (High Sensitivity)     Status: None   Collection Time: 01/20/23  4:43 AM  Result Value Ref Range   Troponin I (High Sensitivity) 5 <18 ng/L    Comment: (NOTE) Elevated high sensitivity troponin I (hsTnI) values and significant  changes across serial measurements may suggest ACS but many other  chronic and acute conditions are known to elevate hsTnI results.  Refer to the "Links" section for chest pain algorithms and additional  guidance. Performed at [facility], [address], [place], [place] [address]   Brain natriuretic peptide     Status: None   Collection Time: 01/20/23  4:43 AM  Result Value Ref Range   B Natriuretic Peptide 17.6 0.0 - 100.0 pg/mL    Comment: Performed at [facility], [address] [place], [place] [address]  Culture, blood (Routine X 2) w Reflex to ID Panel     Status: None (Preliminary result)   Collection Time: 01/20/23  5:04 AM   Specimen: BLOOD RIGHT HAND  Result Value Ref Range   Specimen Description BLOOD RIGHT HAND    Special Requests      BOTTLES DRAWN AEROBIC AND ANAEROBIC Blood Culture adequate volume   Culture      NO GROWTH < 12 HOURS Performed at [facility], [address] [place], [place] [address]    Report Status PENDING   Culture, blood (Routine X 2) w Reflex to ID Panel     Status: None (Preliminary result)   Collection Time: 01/20/23  5:04 AM   Specimen: BLOOD LEFT HAND  Result Value Ref Range   Specimen Description BLOOD LEFT HAND    Special Requests       BOTTLES DRAWN AEROBIC AND ANAEROBIC Blood Culture adequate volume   Culture      NO GROWTH < 12 HOURS Performed at [facility], [address], [place], [place] [address]    Report Status PENDING   I-Stat CG4 Lactic Acid  Status: Abnormal   Collection Time: 01/20/23  5:22 AM  Result Value Ref Range   Lactic Acid, Venous 2.4 (HH) 0.5 - 1.9 mmol/L   Comment NOTIFIED PHYSICIAN   Rapid urine drug screen (hospital performed)     Status: Abnormal   Collection Time: 01/20/23  6:46 AM  Result Value Ref Range   Opiates NONE DETECTED NONE DETECTED   Cocaine NONE DETECTED NONE DETECTED   Benzodiazepines POSITIVE (A) NONE DETECTED   Amphetamines NONE DETECTED NONE DETECTED   Tetrahydrocannabinol NONE DETECTED NONE DETECTED   Barbiturates NONE DETECTED NONE DETECTED    Comment: (NOTE) DRUG SCREEN FOR MEDICAL PURPOSES ONLY.  IF CONFIRMATION IS NEEDED FOR ANY PURPOSE, NOTIFY LAB WITHIN 5 DAYS.  LOWEST DETECTABLE LIMITS FOR URINE DRUG SCREEN Drug Class                     Cutoff (ng/mL) Amphetamine and metabolites    1000 Barbiturate and metabolites    200 Benzodiazepine                 200 Opiates and metabolites        300 Cocaine and metabolites        300 THC                            50 Performed at [facility], [address] [place], [place] [address]   Urinalysis, w/ Reflex to Culture (Infection Suspected) -Urine, Clean Catch     Status: None   Collection Time: 01/20/23  6:46 AM  Result Value Ref Range   Specimen Source URINE, CLEAN CATCH    Color, Urine YELLOW YELLOW   APPearance CLEAR CLEAR   Specific Gravity, Urine 1.017 1.005 - 1.030   pH 7.0 5.0 - 8.0   Glucose, UA NEGATIVE NEGATIVE mg/dL   Hgb urine dipstick NEGATIVE NEGATIVE   Bilirubin Urine NEGATIVE NEGATIVE   Ketones, ur NEGATIVE NEGATIVE mg/dL   Protein, ur NEGATIVE NEGATIVE mg/dL   Nitrite NEGATIVE NEGATIVE   Leukocytes,Ua NEGATIVE NEGATIVE   RBC / HPF 0-5 0 - 5 RBC/hpf   WBC, UA 0-5 0 - 5  WBC/hpf    Comment:        Reflex urine culture not performed if WBC <=10, OR if Squamous epithelial cells >5. If Squamous epithelial cells >5 suggest recollection.    Bacteria, UA NONE SEEN NONE SEEN   Squamous Epithelial / HPF 0-5 0 - 5 /HPF    Comment: Performed at [facility], [address] [place], [place] [address]  Troponin I (High Sensitivity)     Status: None   Collection Time: 01/20/23  6:46 AM  Result Value Ref Range   Troponin I (High Sensitivity) 6 <18 ng/L    Comment: (NOTE) Elevated high sensitivity troponin I (hsTnI) values and significant  changes across serial measurements may suggest ACS but many other  chronic and acute conditions are known to elevate hsTnI results.  Refer to the "Links" section for chest pain algorithms and additional  guidance. Performed at [facility], [address], [place], [place] [address]   I-Stat CG4 Lactic Acid     Status: Abnormal   Collection Time: 01/20/23  7:04 AM  Result Value Ref Range   Lactic Acid, Venous 3.1 (HH) 0.5 - 1.9 mmol/L   Comment NOTIFIED PHYSICIAN   Procalcitonin     Status: None   Collection  Time: 01/20/23 11:35 AM  Result Value Ref Range   Procalcitonin 0.14 ng/mL    Comment:        Interpretation: PCT (Procalcitonin) <= 0.5 ng/mL: Systemic infection (sepsis) is not likely. Local bacterial infection is possible. (NOTE)       Sepsis PCT Algorithm           Lower Respiratory Tract                                      Infection PCT Algorithm    ----------------------------     ----------------------------         PCT < 0.25 ng/mL                PCT < 0.10 ng/mL          Strongly encourage             Strongly discourage   discontinuation of antibiotics    initiation of antibiotics    ----------------------------     -----------------------------       PCT 0.25 - 0.50 ng/mL            PCT 0.10 - 0.25 ng/mL               OR       >80% decrease in PCT            Discourage initiation of                                             antibiotics      Encourage discontinuation           of antibiotics    ----------------------------     -----------------------------         PCT >= 0.50 ng/mL              PCT 0.26 - 0.50 ng/mL               AND        <80% decrease in PCT             Encourage initiation of                                             antibiotics       Encourage continuation           of antibiotics    ----------------------------     -----------------------------        PCT >= 0.50 ng/mL                  PCT > 0.50 ng/mL               AND         increase in PCT                  Strongly encourage                                      initiation of antibiotics    Strongly encourage escalation  of antibiotics                                     -----------------------------                                           PCT <= 0.25 ng/mL                                                 OR                                        > 80% decrease in PCT                                      Discontinue / Do not initiate                                             antibiotics  Performed at [facility], [address], [place], [place] [address]    CT Angio Chest Pulmonary Embolism (PE) W or WO Contrast  Result Date: 01/20/2023 CLINICAL DATA:  Evaluate for pulmonary embolism. High probability. Alcohol detox. EXAM: CT ANGIOGRAPHY CHEST WITH CONTRAST TECHNIQUE: Multidetector CT imaging of the chest was performed using the standard protocol during bolus administration of intravenous contrast. Multiplanar CT image reconstructions and MIPs were obtained to evaluate the vascular anatomy. RADIATION DOSE REDUCTION: This exam was performed according to the departmental dose-optimization program which includes automated exposure control, adjustment of the mA and/or kV according to patient size and/or use of iterative reconstruction technique. CONTRAST:  75mL OMNIPAQUE  IOHEXOL 350 MG/ML SOLN COMPARISON:  None Available. FINDINGS: Cardiovascular: Satisfactory opacification of the pulmonary arteries to the segmental level. No evidence of pulmonary embolism. Normal heart size. No pericardial effusion. Mediastinum/Nodes: No enlarged mediastinal, hilar, or axillary lymph nodes. Thyroid gland, trachea, and esophagus demonstrate no significant findings. Lungs/Pleura: No pleural effusion or pneumothorax. Partial atelectasis of the anterior basal right middle lobe. Multifocal ground-glass and airspace scratch there is diffuse, patchy ground-glass and airspace densities throughout the right lung which is concerning for multifocal infection versus sequelae of aspiration. Left lung appears clear. Upper Abdomen: Small hiatal hernia. Diffuse hepatic steatosis. There is hypertrophy of the caudate lobe and lateral segment of left hepatic lobe. Status post cholecystectomy. Musculoskeletal: No acute or suspicious osseous findings. Review of the MIP images confirms the above findings. IMPRESSION: 1. No pulmonary embolism. 2. Diffuse, patchy ground-glass and airspace densities throughout the right lung which is concerning for multifocal infection versus sequelae of aspiration. 3. Diffuse hepatic steatosis. There is hypertrophy of the caudate lobe and lateral segment of left hepatic lobe. Findings are suggestive of cirrhosis. Electronically Signed   By: Signa Kell M.D.   On: 01/20/2023 06:40   DG Chest 2 View  Result Date: 01/20/2023 CLINICAL DATA:  Chest pain.  Alcohol  detox EXAM: CHEST - 2 VIEW COMPARISON:  11/17/2021 FINDINGS: Volume loss and hazy density at the right base. No edema, effusion, or pneumothorax. Normal heart size. IMPRESSION: Right base infiltrate, usually pneumonia. Followup PA and lateral chest X-ray is recommended in 3-4 weeks following trial of antibiotic therapy to ensure resolution. Electronically Signed   By: Tiburcio Pea M.D.   On: 01/20/2023 06:17    Pending  Labs Unresulted Labs (From admission, onward)     Start     Ordered   01/21/23 0500  Basic metabolic panel  Tomorrow morning,   R        01/20/23 0945   01/21/23 0500  CBC  Tomorrow morning,   R        01/20/23 0945            Vitals/Pain Today's Vitals   01/20/23 1124 01/20/23 1255 01/20/23 1507 01/20/23 1522  BP: (!) 113/94  106/81 124/89  Pulse: (!) 103  85 98  Resp: 20   (!) 22  Temp:  98.9 F (37.2 C)  98.1 F (36.7 C)  TempSrc:  Oral  Oral  SpO2: 95%   (!) 5%  Weight:      Height:      PainSc:        Isolation Precautions No active isolations  Medications Medications  azithromycin (ZITHROMAX) 500 mg in sodium chloride 0.9 % 250 mL IVPB (has no administration in time range)  cefTRIAXone (ROCEPHIN) 1 g in sodium chloride 0.9 % 100 mL IVPB (has no administration in time range)  buprenorphine-naloxone (SUBOXONE) 8-2 mg per SL tablet 1 tablet (1 tablet Sublingual Not Given 01/20/23 1127)  buPROPion (WELLBUTRIN XL) 24 hr tablet 300 mg (300 mg Oral Given 01/20/23 1129)  escitalopram (LEXAPRO) tablet 10 mg (10 mg Oral Given 01/20/23 1127)  pantoprazole (PROTONIX) EC tablet 40 mg (40 mg Oral Given 01/20/23 1128)  albuterol (PROVENTIL) (2.5 MG/3ML) 0.083% nebulizer solution 2.5 mg (has no administration in time range)  LORazepam (ATIVAN) tablet 1-4 mg (1 mg Oral Given 01/20/23 1620)  thiamine (VITAMIN B1) tablet 100 mg (100 mg Oral Given 01/20/23 1127)    Or  thiamine (VITAMIN B1) injection 100 mg ( Intravenous See Alternative 01/20/23 1127)  folic acid (FOLVITE) tablet 1 mg (1 mg Oral Given 01/20/23 1127)  multivitamin with minerals tablet 1 tablet (1 tablet Oral Given 01/20/23 1128)  enoxaparin (LOVENOX) injection 40 mg (40 mg Subcutaneous Given 01/20/23 1130)  0.9 %  sodium chloride infusion ( Intravenous New Bag/Given 01/20/23 1140)  acetaminophen (TYLENOL) tablet 650 mg (has no administration in time range)    Or  acetaminophen (TYLENOL) suppository 650 mg (has no  administration in time range)  docusate sodium (COLACE) capsule 100 mg (100 mg Oral Given 01/20/23 1127)  polyethylene glycol (MIRALAX / GLYCOLAX) packet 17 g (has no administration in time range)  bisacodyl (DULCOLAX) EC tablet 5 mg (has no administration in time range)  ondansetron (ZOFRAN) tablet 4 mg (has no administration in time range)    Or  ondansetron (ZOFRAN) injection 4 mg (has no administration in time range)  guaiFENesin (MUCINEX) 12 hr tablet 600 mg (has no administration in time range)  hydrALAZINE (APRESOLINE) injection 5 mg (has no administration in time range)  sodium chloride flush (NS) 0.9 % injection 3 mL (3 mLs Intravenous Given 01/20/23 1131)  lactated ringers bolus 1,000 mL (0 mLs Intravenous Stopped 01/20/23 0542)  lactated ringers bolus 1,000 mL (0 mLs Intravenous Stopped 01/20/23 0630)  iohexol (  OMNIPAQUE) 350 MG/ML injection 75 mL (75 mLs Intravenous Contrast Given 01/20/23 0618)  cefTRIAXone (ROCEPHIN) 2 g in sodium chloride 0.9 % 100 mL IVPB (0 g Intravenous Stopped 01/20/23 0728)  metroNIDAZOLE (FLAGYL) IVPB 500 mg (0 mg Intravenous Stopped 01/20/23 1029)  azithromycin (ZITHROMAX) 500 mg in sodium chloride 0.9 % 250 mL IVPB (0 mg Intravenous Stopped 01/20/23 0836)    Mobility walks     Focused Assessments Alcohol intoxication   R Recommendations: See Admitting Provider Note  Report given to:   Additional Notes: CIWA every hours last time gave him Ativan 1 mg tablet was 1626. Patient report nausea earlier, but no chest pain

## 2023-01-20 NOTE — ED Notes (Signed)
Pt in his room vomitting and when asked how he is feeling, report "shaddy". Vital signs taken

## 2023-01-20 NOTE — ED Provider Notes (Signed)
[place] EMERGENCY DEPARTMENT AT [facility] Provider Note   CSN: [id number] Arrival date & time: 01/20/23  4098     History  Chief Complaint  Patient presents with   Alcohol Intoxication    Eric Nolan is a 42 y.o. male.  Patient sent from [facility] for evaluation.  He has been there for 4 days.  He is therefore alcohol detox.  He reports that the first 3 days he had a lot of withdrawal symptoms, then started feeling better.  Then tonight he started to feel poorly again, became acutely ill, nausea, vomited multiple times.  He thinks that he aspirated as this has happened in the past.  He is experiencing pain in the center of his low chest but this has been present for months.  It is not significantly changed.  Patient does notice increased shortness of breath.       Home Medications Prior to Admission medications   Medication Sig Start Date End Date Taking? Authorizing Provider  acetaminophen (TYLENOL) 325 MG tablet Take 2 tablets (650 mg total) by mouth 3 (three) times daily at 8am, 2pm and bedtime. 01/17/23   Ajibola, Gerrianne Scale A, NP  albuterol (VENTOLIN HFA) 108 (90 Base) MCG/ACT inhaler Inhale 2 puffs into the lungs every 6 (six) hours as needed for wheezing or shortness of breath. 01/17/23   Ajibola, Ene A, NP  alum & mag hydroxide-simeth (MAALOX/MYLANTA) 200-200-20 MG/5ML suspension Take 30 mLs by mouth every 4 (four) hours as needed for indigestion. 01/17/23   Ajibola, Ene A, NP  buprenorphine-naloxone (SUBOXONE) 8-2 mg SUBL SL tablet Place 1 tablet under the tongue 2 (two) times daily.    [provider]  buPROPion (WELLBUTRIN XL) 300 MG 24 hr tablet Take 300 mg by mouth every morning. 04/14/21   [provider]  escitalopram (LEXAPRO) 10 MG tablet Take 10 mg by mouth daily.    [provider]  fluticasone (FLOVENT HFA) 110 MCG/ACT inhaler Inhale 2 puffs into the lungs 2 (two) times daily. Patient taking differently: Inhale 2 puffs into the  lungs 2 (two) times daily as needed (For shortness of breath). 09/04/21   Martina Sinner, MD  folic acid (FOLVITE) 1 MG tablet Take 1 tablet by mouth daily.    [provider]  omeprazole (PRILOSEC) 20 MG capsule Take 2 capsules (40 mg total) by mouth daily. Patient taking differently: Take 20 mg by mouth 2 (two) times daily before a meal. 11/17/21 01/14/23  Loeffler, Finis Bud, PA-C  tadalafil (CIALIS) 10 MG tablet Take 10 mg by mouth daily as needed for erectile dysfunction.    [provider]      Allergies    Patient has no known allergies.    Review of Systems   Review of Systems  Physical Exam Updated Vital Signs BP 114/86   Pulse 94   Temp 98.1 F (36.7 C) (Oral)   Resp (!) 26   Ht 5\' 10"  (1.778 m)   Wt 97.5 kg   SpO2 96%   BMI 30.85 kg/m  Physical Exam Vitals and nursing note reviewed.  Constitutional:      General: He is not in acute distress.    Appearance: He is well-developed. He is ill-appearing.  HENT:     Head: Normocephalic and atraumatic.     Mouth/Throat:     Mouth: Mucous membranes are moist.  Eyes:     General: Vision grossly intact. Gaze aligned appropriately.     Extraocular Movements: Extraocular  movements intact.     Conjunctiva/sclera: Conjunctivae normal.  Cardiovascular:     Rate and Rhythm: Regular rhythm. Tachycardia present.     Pulses: Normal pulses.     Heart sounds: Normal heart sounds, S1 normal and S2 normal. No murmur heard.    No friction rub. No gallop.  Pulmonary:     Effort: Pulmonary effort is normal. Tachypnea present. No respiratory distress.     Breath sounds: Decreased air movement present. Rhonchi present.  Abdominal:     Palpations: Abdomen is soft.     Tenderness: There is no abdominal tenderness. There is no guarding or rebound.     Hernia: No hernia is present.  Musculoskeletal:        General: No swelling.     Cervical back: Full passive range of motion without pain, normal range of motion and neck  supple. No pain with movement, spinous process tenderness or muscular tenderness. Normal range of motion.     Right lower leg: No edema.     Left lower leg: No edema.  Skin:    General: Skin is warm and dry.     Capillary Refill: Capillary refill takes less than 2 seconds.     Findings: No ecchymosis, erythema, lesion or wound.  Neurological:     Mental Status: He is alert and oriented to person, place, and time.     GCS: GCS eye subscore is 4. GCS verbal subscore is 5. GCS motor subscore is 6.     Cranial Nerves: Cranial nerves 2-12 are intact.     Sensory: Sensation is intact.     Motor: Motor function is intact. No weakness or abnormal muscle tone.     Coordination: Coordination is intact.  Psychiatric:        Mood and Affect: Mood normal.        Speech: Speech normal.        Behavior: Behavior normal.     ED Results / Procedures / Treatments   Labs (all labs ordered are listed, but only abnormal results are displayed) Labs Reviewed  COMPREHENSIVE METABOLIC PANEL - Abnormal; Notable for the following components:      Result Value   Glucose, Bld 135 (*)    Total Protein 6.1 (*)    AST 54 (*)    ALT 45 (*)    All other components within normal limits  I-STAT CG4 LACTIC ACID, ED - Abnormal; Notable for the following components:   Lactic Acid, Venous 2.4 (*)    All other components within normal limits  CULTURE, BLOOD (ROUTINE X 2)  CULTURE, BLOOD (ROUTINE X 2)  ETHANOL  CBC  BRAIN NATRIURETIC PEPTIDE  RAPID URINE DRUG SCREEN, HOSP PERFORMED  URINALYSIS, W/ REFLEX TO CULTURE (INFECTION SUSPECTED)  I-STAT CG4 LACTIC ACID, ED  TROPONIN I (HIGH SENSITIVITY)  TROPONIN I (HIGH SENSITIVITY)    EKG EKG Interpretation Date/Time:  Tuesday January 20 2023 04:36:15 EDT Ventricular Rate:  101 PR Interval:  127 QRS Duration:  86 QT Interval:  353 QTC Calculation: 458 R Axis:   79  Text Interpretation: Sinus tachycardia Otherwise within normal limits Confirmed by Gilda Crease [phone]) on 01/20/2023 5:41:52 AM  Radiology CT Angio Chest Pulmonary Embolism (PE) W or WO Contrast  Result Date: 01/20/2023 CLINICAL DATA:  Evaluate for pulmonary embolism. High probability. Alcohol detox. EXAM: CT ANGIOGRAPHY CHEST WITH CONTRAST TECHNIQUE: Multidetector CT imaging of the chest was performed using the standard protocol during bolus administration of intravenous contrast. Multiplanar  CT image reconstructions and MIPs were obtained to evaluate the vascular anatomy. RADIATION DOSE REDUCTION: This exam was performed according to the departmental dose-optimization program which includes automated exposure control, adjustment of the mA and/or kV according to patient size and/or use of iterative reconstruction technique. CONTRAST:  75mL OMNIPAQUE IOHEXOL 350 MG/ML SOLN COMPARISON:  None Available. FINDINGS: Cardiovascular: Satisfactory opacification of the pulmonary arteries to the segmental level. No evidence of pulmonary embolism. Normal heart size. No pericardial effusion. Mediastinum/Nodes: No enlarged mediastinal, hilar, or axillary lymph nodes. Thyroid gland, trachea, and esophagus demonstrate no significant findings. Lungs/Pleura: No pleural effusion or pneumothorax. Partial atelectasis of the anterior basal right middle lobe. Multifocal ground-glass and airspace scratch there is diffuse, patchy ground-glass and airspace densities throughout the right lung which is concerning for multifocal infection versus sequelae of aspiration. Left lung appears clear. Upper Abdomen: Small hiatal hernia. Diffuse hepatic steatosis. There is hypertrophy of the caudate lobe and lateral segment of left hepatic lobe. Status post cholecystectomy. Musculoskeletal: No acute or suspicious osseous findings. Review of the MIP images confirms the above findings. IMPRESSION: 1. No pulmonary embolism. 2. Diffuse, patchy ground-glass and airspace densities throughout the right lung which is concerning for  multifocal infection versus sequelae of aspiration. 3. Diffuse hepatic steatosis. There is hypertrophy of the caudate lobe and lateral segment of left hepatic lobe. Findings are suggestive of cirrhosis. Electronically Signed   By: Signa Kell M.D.   On: 01/20/2023 06:40   DG Chest 2 View  Result Date: 01/20/2023 CLINICAL DATA:  Chest pain.  Alcohol detox EXAM: CHEST - 2 VIEW COMPARISON:  11/17/2021 FINDINGS: Volume loss and hazy density at the right base. No edema, effusion, or pneumothorax. Normal heart size. IMPRESSION: Right base infiltrate, usually pneumonia. Followup PA and lateral chest X-ray is recommended in 3-4 weeks following trial of antibiotic therapy to ensure resolution. Electronically Signed   By: Tiburcio Pea M.D.   On: 01/20/2023 06:17    Procedures Procedures    Medications Ordered in ED Medications  cefTRIAXone (ROCEPHIN) 2 g in sodium chloride 0.9 % 100 mL IVPB (has no administration in time range)  metroNIDAZOLE (FLAGYL) IVPB 500 mg (has no administration in time range)  azithromycin (ZITHROMAX) 500 mg in sodium chloride 0.9 % 250 mL IVPB (has no administration in time range)  lactated ringers bolus 1,000 mL (0 mLs Intravenous Stopped 01/20/23 0542)  lactated ringers bolus 1,000 mL (0 mLs Intravenous Stopped 01/20/23 0630)  iohexol (OMNIPAQUE) 350 MG/ML injection 75 mL (75 mLs Intravenous Contrast Given 01/20/23 0618)    ED Course/ Medical Decision Making/ A&P                             Medical Decision Making Amount and/or Complexity of Data Reviewed Labs: ordered. Decision-making details documented in ED Course. Radiology: ordered and independent interpretation performed. Decision-making details documented in ED Course.  Risk Prescription drug management.   Differential diagnosis considered includes, but not limited to: Alcohol withdrawal; Community acquired pneumonia; aspiration pneumonia; pulmonary embolism  Presents to the emergency department from  behavioral [facility] urgent care center where he is currently undergoing alcohol detox.  Patient has been there for 4 days.  It seems that he made it through the first 3 days and was starting to feel better from a withdrawal standpoint and then tonight became acutely ill.  Patient reports that he became weak and started shaking all over.  He acutely became nauseated and vomited  multiple times.  He thinks that he aspirated as this has happened before.  He arrives in the ED on supplemental oxygen, noted to be hypoxic on room air.  He is complaining of some chest discomfort but reports that this has been ongoing for months and is essentially unchanged.  Mildly tachycardic and tachypneic at arrival.  No fever.  Vital signs per improved with fluids.  No hypotension.  Lactic acid slightly elevated at 2.4.  Chest x-ray with right basilar infiltrate.  Treat as community-acquired pneumonia with Rocephin and Zithromax, and Flagyl for presumed aspiration.  Patient maintained on nasal cannula oxygen with resolution of his hypoxia.        Final Clinical Impression(s) / ED Diagnoses Final diagnoses:  Pneumonia of right lower lobe due to infectious organism    Rx / DC Orders ED Discharge Orders     None         Izaih Kataoka, Canary Brim, MD 01/20/23 [phone]

## 2023-01-21 ENCOUNTER — Inpatient Hospital Stay (HOSPITAL_COMMUNITY): Payer: MEDICAID

## 2023-01-21 DIAGNOSIS — J9601 Acute respiratory failure with hypoxia: Secondary | ICD-10-CM | POA: Diagnosis not present

## 2023-01-21 MED ORDER — GABAPENTIN 300 MG PO CAPS
300.0000 mg | ORAL_CAPSULE | Freq: Two times a day (BID) | ORAL | Status: DC
  Administered 2023-01-21 – 2023-01-22 (×3): 300 mg via ORAL
  Filled 2023-01-21 (×3): qty 1

## 2023-01-21 MED ORDER — SODIUM CHLORIDE 0.9 % IV SOLN
3.0000 g | Freq: Four times a day (QID) | INTRAVENOUS | Status: DC
  Administered 2023-01-21 – 2023-01-22 (×5): 3 g via INTRAVENOUS
  Filled 2023-01-21 (×5): qty 8

## 2023-01-21 NOTE — Discharge Instructions (Signed)
In a time of Crisis: [facility]  Mobile Crisis Management provides immediate crisis response, 24/7.  Call [phone]  [facility] for MH/DD/[facility] is available 24 hours a day, 7 days a week. Customer Service Specialists will assist you to find a crisis provider that is well-matched with your needs. Your local number is: [phone]  [facility] ([facility]) IOP, individual counseling, medication management [address] [place], [place] [address] [phone] Call for intake hours; Medicaid and Uninsured    Outpatient Providers  [facility] ([facility]) Group and individual counseling. [address]  [place], [place] [address] [phone] [place]: [phone]  [place]: [phone] Medicaid and uninsured.   [facility] Offers IOP groups multiple times per week. [address] [place], [place] [address] [phone] Takes Medicaid and other insurances.   [facility]  Chemical Dependency Intensive Outpatient Program (IOP) [address] [place], [place] [address] [phone] Takes Nurse, learning disability and [place].   [facility]  IOP and Partial Hospitalization Program  [address]  [place], [place] [address] [phone] Private Insurance, [place] only for partial hospitalization  [facility] [address], [address], [place], [place] [address] [phone] Monday-Friday. Short and Long term options. [facility] ([facility]) IOP, individual counseling, medication management [address] [place], [place] [address] [phone] Medicaid and [facility]  [facility] [address]  [place], [place] [address] [phone] Private Insurance and Self Pay   [facility] [address]  [place], [place] [address] [phone] Private Insurance, [place], and Self Pay   [facility]  [address] [place], [place] [address] [facility]  [address]  [place], [place] [address] [phone]  [facility]  [address] [place], [place] [address] [phone]      Residential Treatment Programs  [facility] [address] [place], [place] [address] [phone] or [phone] Detox and Residential Rehab 14 days (Medicare, Medicaid, private insurance, and self pay). No methadone. Call for pre-screen.   [facility]) [address]  [place], [place] [address] [phone] Detox (self Pay and Medicaid Limited availability) Rehab Only Male (Medicare, Medicaid, and Self Pay)-No methadone.  [facility] [address] [place], [place] [address] [phone] or [phone] Private Insurance only  [facility] [address] [place], [place] [address] Phone:  [phone] Must be detoxed 72 hours prior to admission; 28 day program.  Self-pay.  [facility] [address]  [place], [place] [phone] [employer], Medicare, [place] (not straight [place]). They offer assistance with transportation.   [facility] [address] [place],  [place], [place] [address] [phone] Christian Based Program. Men only. No insurance  [facility] [address] [place], [place] [address] Women's: [phone] Men's: [phone] No Medicaid.   [facility] [place] Locations across the [place] (mainly [place]) willing to help with transportation.  [phone] [employer]. [facility]  [address] [place]  [place], [place] [address] [phone] Treatment Only, must make assessment appointment, and must be sober for assessment appointment. Self  pay, Medicare A and B, [facility], must be [facility] resident. No methadone.   [facility]  [address] [place], [place] [address] [phone] No pending legal charges, Long-term work program. No methadone. Call for assessment.  [facility]  [address] [place], [place] [address] [phone] or [phone] Commercial Insurance Only  [facility] - [phone] Private Insurance (no [place]). Males/Females, call to make referrals, multiple facilities   [facility] [address]  [place], [place] [address]  [phone] Men Only Upfront Fee   [facility]-no methadone [facility] [address] [place], [place] [address] [phone] (f)         AA Meetings Website to locate meetings (virtually or in person): [email or web address].[email or web address].biz/ Phone: [phone]  Syringe Services Program: Due to COVID-19, syringe services programs are likely operating under different hours with limited or no fixed site hours. Some programs may not be operating at all. Please contact the program directly using the phone numbers provided below to see if they are still operating under COVID-19.  [facility] Solution to the Opioid Problem (GCSTOP) Fixed; mobile; peer-based Roxy Cedar [phone] [email or web address] Fixed site exchange at [facility], [address] [place], [place] [address] on Wednesdays (2:00 - 5:00 pm) and Thursdays (4:00 - 8:00 pm). Pop-up mobile exchange locations: [employer], [address] [place], [place] [address] on Tuesdays (11:00 am - 1:00 pm) and Fridays (11:00 am - 1:00 pm)  -[facility] - [address], [place], [place] [address] on Tuesdays (2:00 - 4:00 pm) and Fridays (2:00 - 4:00 pm)  -[facility] copy - also serves Radio broadcast assistant and [employer]; Fixed; mobile; peer-based;  Lendon Ka [phone] [email or web address] [address], [place], [place] [address] Delivery and outreach available in [place] and [place], please call for more information. Monday, Tuesday: 1:00 -7:00 pm, Thursday: 4:00 pm - 8:00 pm or Friday: 1:00 pm - 8:00 pm  Medication-Assisted Treatment (MAT):  -[facility]- services [address] and surrounding areas including [place], [place], [place], [place], [address], [place], [place], [place], [place], and [place], [place]. Options include Methadone, buprenorphine or Suboxone. [address] [place], [place] [address] Phone: [phone] Mon - Fri: 5:30am - 2:00pm Sat: 5:30am -7:30am Sun: Closed Holidays: 6:00am - 8:00am  -[facility]- We use FDA-approved medications, like methadone/suboxone/sublocade, and vivitrol. These medications are then combined with customized care plans that include individual or group counseling, toxicology, and medical care directed by on-site physicians. Accepts most insurance plans, Medicaid, and private pay.  [address] [place], [place] [address] Phone: [phone] Monday-Friday 5:00 AM - 10:00 AM Saturday 6:00 AM - 8:30 AM Sunday 6:00 AM - 7:00 AM  -[facility]- [facility] is a treatment & recovery focused program. In addition to receiving methadone medication, our clients participate in individual and group counseling as well as random drug testing. If accepted into the [facility] Opioid Program, you will be provided several intake appointments and a physical exam [address] [place], [place] [address] Office: [phone]  Fax: [phone]  -[facility]- We put our community members at the center of everything we do, for remote treatment services as well as in-person, from alcohol withdrawal to opioid use and more.  [address], [place], [place] [address] [phone] Monday-Wednesday:  9:00am - 5:00pm Thursday: 9:00am - 6:00pm Friday: 9:00am - 5:00pm Saturday: 9:00am - 1:00pm Sunday: Closed  -[facility]) [address] [place], [place] [address] [phone]  [phone]  [place] [phone] [address] [place], [place] [address]  M-W    5:00am-12:00pm Thu     5:00am-10:00am Fri       5:00am-12:00pm Sat      5:00am-8:00am Sun     Closed  $12/daily for Methadone Treatment.       Follow with Primary MD Diamantina Providence, FNP in 7 days   Get CBC, CMP, 2 view Chest X ray -  checked next visit with your primary MD   Activity: As tolerated with Full fall precautions use walker/cane & assistance as needed  Disposition Home    Diet: Heart Healthy    Special Instructions: If you have smoked or chewed Tobacco  in the last 2 yrs please stop smoking, stop any regular Alcohol  and or any Recreational drug use.  On your next visit with your primary care physician please Get Medicines reviewed and adjusted.  Please request your Prim.MD to go over all Hospital Tests and Procedure/Radiological results at the follow up, please get all Hospital records sent to your Prim MD by signing hospital release before you go home.  If you experience worsening of your admission symptoms, develop shortness of breath, life threatening emergency, suicidal or homicidal thoughts you must seek medical attention immediately by calling 911 or calling your MD immediately  if symptoms less severe.  You Must read complete instructions/literature along with all the possible adverse reactions/side effects for all the Medicines you take and that have been prescribed to you. Take any new Medicines after you have completely understood and accpet all the possible adverse reactions/side effects.   Do not drive when taking Pain medications.  Do not take more than prescribed Pain, Sleep and Anxiety Medications

## 2023-01-21 NOTE — TOC Initial Note (Addendum)
Transition of Care [facility]) - Initial/Assessment Note    Patient Details  Name: Eric Nolan MRN: [id number] Date of Birth: 1981/02/15  [facility]) CM/SW Contact:    Mearl Latin, LCSW Phone Number: 01/21/2023, 9:02 AM  Clinical Narrative:                 Patient [facility] from [facility] for alcohol detox. CSW spoke with patient and he stated he will likely return home with his girlfriend and then go to [facility] as they were in the process of helping get him to somewhere like [facility]. CSW made him aware that additional ETOH cessation resources were placed on AVS just in case. Patient stated he will have a ride home.   Expected Discharge Plan: Home/Self Care Barriers to Discharge: Continued Medical Work up   Patient Goals and CMS Choice            Expected Discharge Plan and Services In-house Referral: Clinical Social Work                                            Prior Living Arrangements/Services     Patient language and need for interpreter reviewed:: Yes              Criminal Activity/Legal Involvement Pertinent to Current Situation/Hospitalization: No - Comment as needed  Activities of Daily Living      Permission Sought/Granted                  Emotional Assessment Appearance:: Appears stated age     Orientation: : Oriented to Self, Oriented to Place, Oriented to  Time, Oriented to Situation Alcohol / Substance Use: Alcohol Use Psych Involvement: No (comment)  Admission diagnosis:  Pneumonia of right lower lobe due to infectious organism [J18.9] Acute hypoxic respiratory failure (HCC) [J96.01] Patient Active Problem List   Diagnosis Date Noted   Acute hypoxic respiratory failure (HCC) 01/20/2023   Polysubstance abuse (HCC) 01/20/2023   Alcohol use disorder, severe, dependence (HCC) 01/17/2023   Alcohol withdrawal syndrome (HCC) 01/14/2023   Alcohol abuse, daily use 01/14/2023   Drug overdose, accidental or unintentional,  initial encounter 04/30/2021   Hypocalcemia 04/30/2021   Hyperglycemia 04/30/2021   Elevated AST (SGOT) 04/30/2021   Alcohol dependence (HCC) 04/30/2021   Aspiration into airway 04/30/2021   Depression    PCP:  Diamantina Providence, FNP Pharmacy:   [facility] [address], [place] - [address] [place] [address] Phone: [phone] Fax: [phone]  [id number] [place] Otto, [facility] - [address] [place] [address] Phone: [phone] Fax: [phone]     Social Determinants of Health (SDOH) Social History: SDOH Screenings   Food Insecurity: No Food Insecurity (01/17/2023)  Housing: Low Risk  (01/17/2023)  Transportation Needs: No Transportation Needs (01/17/2023)  Utilities: Not At Risk (01/17/2023)  Depression (PHQ2-9): High Risk (01/19/2023)  Social Connections: Unknown (11/05/2021)   [facility]  Tobacco Use: Medium Risk (01/20/2023)   SDOH Interventions:     Readmission Risk Interventions     No data to display

## 2023-01-21 NOTE — Plan of Care (Signed)
Patient states he is feeling better with some symptoms of withdrawal but improving

## 2023-01-21 NOTE — Evaluation (Signed)
Clinical/Bedside Swallow Evaluation Patient Details  Name: Shawnmichael Anders MRN: [id number] Date of Birth: 1980/08/18  Today's Date: 01/21/2023 Time: SLP Start Time (ACUTE ONLY): 0913 SLP Stop Time (ACUTE ONLY): 0920 SLP Time Calculation (min) (ACUTE ONLY): 7 min  Past Medical History:  Past Medical History:  Diagnosis Date   Depression    Gallstones    Past Surgical History: History reviewed. No pertinent surgical history. HPI:  Jaisean Kusnierz is a 42 y.o. male with medical history significant of depression and chronic ETOH dependence presenting from [facility] for ETOH detox.  He has been there since 7/24 but was experiencing nausea, tremors, and low O2 saturations.  Developed acute n/v and then SOB, cough. Found to have acute hypoxic respiratory failure in the setting of right sided pna, potentially aspiration in nature.    Assessment / Plan / Recommendation  Clinical Impression  Pt assessed and presents with a normal oropharyngeal swallow. He states he was lying down when he vomited and feels he may have "inhaled it." Oromotor is functional, volitional cough strong. Consumed approximately 3 oz water via straw without s/s aspiration. Timely and thorough mastication and transit of regular texture. Respirations stable throughout. Recommend continue regular texture/thin liquids and no further ST warranted with pt in agreement. SLP Visit Diagnosis: Dysphagia, unspecified (R13.10)    Aspiration Risk  No limitations    Diet Recommendation Regular;Thin liquid    Liquid Administration via: Cup;Straw Medication Administration: Whole meds with liquid Supervision: Patient able to self feed Compensations: Small sips/bites;Slow rate Postural Changes: Seated upright at 90 degrees    Other  Recommendations Oral Care Recommendations: Oral care BID    Recommendations for follow up therapy are one component of a multi-disciplinary discharge planning process, led by the attending physician.   Recommendations may be updated based on patient status, additional functional criteria and insurance authorization.  Follow up Recommendations No SLP follow up      Assistance Recommended at Discharge    Functional Status Assessment Patient has not had a recent decline in their functional status  Frequency and Duration            Prognosis        Swallow Study   General Date of Onset: 01/20/23 HPI: Danian Yeoman is a 42 y.o. male with medical history significant of depression and chronic ETOH dependence presenting from [facility] for ETOH detox.  He has been there since 7/24 but was experiencing nausea, tremors, and low O2 saturations.  Developed acute n/v and then SOB, cough. Found to have acute hypoxic respiratory failure in the setting of right sided pna, potentially aspiration in nature. Type of Study: Bedside Swallow Evaluation Previous Swallow Assessment:  (none) Diet Prior to this Study: Regular;Thin liquids (Level 0) Temperature Spikes Noted: No Respiratory Status: Room air History of Recent Intubation: No Behavior/Cognition: Alert;Cooperative;Pleasant mood Oral Cavity Assessment: Within Functional Limits Oral Care Completed by SLP: No Oral Cavity - Dentition: Adequate natural dentition Vision: Functional for self-feeding Self-Feeding Abilities: Able to feed self Patient Positioning: Upright in bed Baseline Vocal Quality: Normal Volitional Cough: Strong Volitional Swallow: Able to elicit    Oral/Motor/Sensory Function Overall Oral Motor/Sensory Function: Within functional limits   Ice Chips Ice chips: Not tested   Thin Liquid Thin Liquid: Within functional limits Presentation: Straw    Nectar Thick Nectar Thick Liquid: Not tested   Honey Thick Honey Thick Liquid: Not tested   Puree Puree: Not tested   Solid     Solid: Within functional  limits      Royce Macadamia 01/21/2023,9:31 AM

## 2023-01-21 NOTE — Progress Notes (Signed)
   Overview: Eric Nolan is a 42 y.o. with PMH of alcohol use disorder, MDD presenting from [facility] given flu like symptoms admitted for acute hypoxic respiratory failure on hospital day 1  Subjective:  Overnight:NAEON  States feels well. Has some pleuritic chest pain. States withdrawals symptoms seem to be subsiding.   Objective: Afebrile, normotensive, satting well on RA  Vital signs in last 24 hours: Vitals:   01/20/23 1522 01/20/23 1845 01/20/23 2116 01/21/23 0410  BP: 124/89  (!) 122/92 119/72  Pulse: 98 89 92 77  Resp: (!) 22 20  20   Temp: 98.1 F (36.7 C)   98.5 F (36.9 C)  TempSrc: Oral   Oral  SpO2: (!) 5% 96% 94% 97%  Weight:      Height:       Supplemental O2: Room Air   SpO2: 97 % O2 Flow Rate (L/min): 3 L/min Filed Weights   01/20/23 0445  Weight: 97.5 kg    Intake/Output Summary (Last 24 hours) at 01/21/2023 1191 Last data filed at 01/21/2023 0500 Gross per 24 hour  Intake 1759.32 ml  Output --  Net 1759.32 ml   Net IO Since Admission: 1,759.32 mL [01/21/23 0628]  Physical Exam General: NAD, slight jittery during interview HENT: NCAT Lungs:  CTAB Cardiovascular: NSR, good pulses in all extremities  Abdomen: No TTP, normal bowel sounds MSK: No asymmetry, good strength in all extremities Skin: no lesions noted on skin Neuro: alert and oriented x4 Psych: normal mood and normal affect  Diagnostics CBC without leukocytosis BMP with normal electrolytes and renal function. Slight hyperglycemia.  CXR showing right sided infiltrate concerning for pneumonia vs pneumonitis.   Assessment/Plan: Principal Problem:   Acute hypoxic respiratory failure (HCC) Active Problems:   Depression   Aspiration into airway   Alcohol use disorder, severe, dependence (HCC)   Polysubstance abuse (HCC)  Aspiration Pneumonia vs Pneumonitis S/p Rocephin, azithro and flagyl. On RA and satting well. Started on Unasyn and Azithromycin today. If pt continues to  improve, can switch to Augmentin for CAP coverage starting tomorrow. Bcx pending.   Elevated Liver Enzymes Alcohol Use Disorder AST 54 and ALT 45. Appears to be improving given significant elevation on 01/14/23. This makes underlying etiology to be alcohol induced as pt last drank at that time and was started on detox at [facility]. On CIWA. Last CIWA score is 2. Will hold off further investigation. Continue MVA, folic acid and thiamine. Pt is interested in medication to cut down alcohol cravings. Will plan to discharge with Acomprosate given pt is on Suboxone BID.   Chronic Problems: MDD/GAD: On Lexapro 10 mg and bupropion 300 mg every day. He was started on Gabapentin 300 mg BID by psychiatry and recommended continuation of this OP.  OUD: pt on Suboxone 8 mg BID, will continue while here.  ED: continue cialis 10 mg every day.   Diet: Normal IVF: NS,75cc/hr VTE: Enoxaparin Code: Full PT/OT recs: Pending Prior to Admission Living Arrangement: Home Anticipated Discharge Location: Home Barriers to Discharge: Medical Stability Dispo: Anticipated discharge in approximately 1 day(s).   Gwenevere Abbot, MD Eligha Bridegroom. [facility] Internal Medicine Residency, PGY-3  Pager: [phone] After 5 pm and on weekends: Please call the on-call pager

## 2023-01-21 NOTE — Plan of Care (Signed)

## 2023-01-21 NOTE — Progress Notes (Signed)
Pharmacy Antibiotic Note  Devern Pitt is a 42 y.o. male admitted on 01/20/2023 with concern for aspirationpneumonia.  Pharmacy has been consulted for Unasyn dosing.  Plan: Unasyn 3g IV Q6H.  Height: 5\' 10"  (177.8 cm) Weight: 97.5 kg (215 lb) IBW/kg (Calculated) : 73  Temp (24hrs), Avg:98.9 F (37.2 C), Min:98.1 F (36.7 C), Max:100 F (37.8 C)  Recent Labs  Lab 01/14/23 1250 01/14/23 1450 01/20/23 0443 01/20/23 0522 01/20/23 0704 01/21/23 0011  WBC 6.3  --  9.3  --   --  9.7  CREATININE  --  0.71 0.93  --   --  0.85  LATICACIDVEN  --   --   --  2.4* 3.1*  --     Estimated Creatinine Clearance: 136.6 mL/min (by C-G formula based on SCr of 0.85 mg/dL).    No Known Allergies   Thank you for allowing pharmacy to be a part of this patient's care.  Vernard Gambles, PharmD, BCPS  01/21/2023 6:05 AM

## 2023-01-22 ENCOUNTER — Other Ambulatory Visit (HOSPITAL_COMMUNITY): Payer: Self-pay

## 2023-01-22 DIAGNOSIS — J9601 Acute respiratory failure with hypoxia: Secondary | ICD-10-CM | POA: Diagnosis not present

## 2023-01-22 MED ORDER — THIAMINE HCL 100 MG PO TABS
100.0000 mg | ORAL_TABLET | Freq: Every day | ORAL | 0 refills | Status: DC
  Filled 2023-01-22: qty 30, 30d supply, fill #0

## 2023-01-22 MED ORDER — GABAPENTIN 300 MG PO CAPS
300.0000 mg | ORAL_CAPSULE | Freq: Two times a day (BID) | ORAL | 0 refills | Status: AC
Start: 2023-01-22 — End: ?
  Filled 2023-01-22: qty 60, 30d supply, fill #0

## 2023-01-22 MED ORDER — AMOXICILLIN-POT CLAVULANATE 875-125 MG PO TABS
1.0000 | ORAL_TABLET | Freq: Two times a day (BID) | ORAL | 0 refills | Status: DC
Start: 2023-01-22 — End: 2023-04-28
  Filled 2023-01-22: qty 10, 5d supply, fill #0

## 2023-01-22 MED ORDER — CHLORDIAZEPOXIDE HCL 5 MG PO CAPS
ORAL_CAPSULE | ORAL | 0 refills | Status: AC
Start: 2023-01-22 — End: 2023-01-31
  Filled 2023-01-22: qty 18, 9d supply, fill #0

## 2023-01-22 NOTE — Discharge Summary (Addendum)
Name: Eric Nolan MRN: [id number] DOB: 1980-07-06 42 y.o. PCP: Diamantina Providence, FNP  Date of Admission: 01/20/2023  4:29 AM Date of Discharge: 01/22/23 Attending Physician: Leroy Sea, MD  Discharge Diagnosis: Principal Problem:   Acute hypoxic respiratory failure [facility]) Active Problems:   Depression   Aspiration into airway   Alcohol use disorder, severe, dependence (HCC)   Polysubstance abuse (HCC)   Discharge Medications: Allergies as of 01/22/2023   No Known Allergies      Medication List     TAKE these medications    acetaminophen 325 MG tablet Commonly known as: TYLENOL Take 2 tablets (650 mg total) by mouth 3 (three) times daily at 8am, 2pm and bedtime.   albuterol 108 (90 Base) MCG/ACT inhaler Commonly known as: VENTOLIN HFA Inhale 2 puffs into the lungs every 6 (six) hours as needed for wheezing or shortness of breath.   alum & mag hydroxide-simeth 200-200-20 MG/5ML suspension Commonly known as: MAALOX/MYLANTA Take 30 mLs by mouth every 4 (four) hours as needed for indigestion.   amoxicillin-clavulanate 875-125 MG tablet Commonly known as: AUGMENTIN Take 1 tablet by mouth 2 (two) times daily.   buprenorphine-naloxone 8-2 mg Subl SL tablet Commonly known as: SUBOXONE Place 1 tablet under the tongue 2 (two) times daily.   buPROPion 300 MG 24 hr tablet Commonly known as: WELLBUTRIN XL Take 300 mg by mouth every morning.   chlordiazePOXIDE 5 MG capsule Commonly known as: LIBRIUM Take 1 capsule (5 mg total) by mouth 3 (three) times daily for 3 days, THEN 1 capsule (5 mg total) 2 (two) times daily for 3 days, THEN 1 capsule (5 mg total) daily for 3 days. Start taking on: January 22, 2023   escitalopram 10 MG tablet Commonly known as: LEXAPRO Take 10 mg by mouth daily.   fluticasone 110 MCG/ACT inhaler Commonly known as: Flovent HFA Inhale 2 puffs into the lungs 2 (two) times daily. What changed:  when to take this reasons to take  this   folic acid 1 MG tablet Commonly known as: FOLVITE Take 1 tablet by mouth daily.   gabapentin 300 MG capsule Commonly known as: NEURONTIN Take 1 capsule (300 mg total) by mouth 2 (two) times daily.   omeprazole 20 MG capsule Commonly known as: PRILOSEC Take 2 capsules (40 mg total) by mouth daily. What changed:  how much to take when to take this   tadalafil 10 MG tablet Commonly known as: CIALIS Take 10 mg by mouth daily as needed for erectile dysfunction.   thiamine 100 MG tablet Commonly known as: VITAMIN B1 Take 1 tablet (100 mg total) by mouth daily. Start taking on: January 23, 2023        Disposition and follow-up:     Follow-up Appointments:  Follow-up Information     Diamantina Providence, FNP. Schedule an appointment as soon as possible for a visit in 1 week(s).   Specialty: Nurse Practitioner Contact information: [address] [place] [address] [phone]         Diamantina Providence, FNP .   Specialty: Nurse Practitioner Contact information: [address] [place] [address] [phone]                 Hospital Course by problem list:  Aspiration Pneumonia vs Pneumonitis PT presented with acute hypoxic respiratory failure in setting of aspiration event. Concern was present for aspiration pneumonia vs pneumonitis. He was treated with empiric antibiotics,  encouraged to sit in chair use as flutter valve, now stable on room air and symptom-free, blood work is stable, will be placed on oral Augmentin for the next 5 days and discharged home.   Elevated Liver Enzymes - Alcohol Use Disorder  - AST 54 and ALT 45.  Trend stable, symptom-free transaminitis consistent with alcohol abuse, PCP to monitor LFTs in the outpatient setting, recheck CMP in 7 to 10 days.  Strictly counseled to quit alcohol.  Currently not in DTs will be placed on low-dose Librium taper.  Strictly counseled to quit alcohol.   MDD/GAD:  Renew home regimen no acute issues follow-up with psychiatry and PCP outpatient.  OUD: pt on Suboxone 8 mg BID, will continue while here.   ED: continue cialis 10 mg every day    Subjective: Patient in bed, appears comfortable, denies any headache, no fever, no chest pain or pressure, no shortness of breath , no abdominal pain. No new focal weakness.    Discharge Exam:    BP (!) 139/94 (BP Location: Left Arm)   Pulse 76   Temp 98.4 F (36.9 C) (Oral)   Resp 18   Ht 5\' 10"  (1.778 m)   Wt 97.5 kg   SpO2 96%   BMI 30.85 kg/m   Awake Alert, No new F.N deficits, Normal affect [place].AT,PERRAL Supple Neck, No JVD,   Symmetrical Chest wall movement, Good air movement bilaterally, CTAB RRR,No Gallops, Rubs or new Murmurs,  +ve B.Sounds, Abd Soft, No tenderness,   No Cyanosis, Clubbing or edema    Recent Labs  Lab 01/20/23 0443 01/21/23 0011 01/22/23 0432  WBC 9.3 9.7 8.1  HGB 15.5 14.3 14.2  HCT 46.4 41.8 42.1  PLT 209 196 219  MCV 96.9 96.1 95.9  MCH 32.4 32.9 32.3  MCHC 33.4 34.2 33.7  RDW 13.8 13.7 13.6  LYMPHSABS  --   --  1.6  MONOABS  --   --  1.1*  EOSABS  --   --  0.2  BASOSABS  --   --  0.1    Recent Labs  Lab 01/20/23 0443 01/20/23 0522 01/20/23 0704 01/20/23 1135 01/21/23 0011 01/22/23 0432  NA 137  --   --   --  137 138  K 4.3  --   --   --  3.7 4.2  CL 98  --   --   --  101 98  CO2 26  --   --   --  27 28  ANIONGAP 13  --   --   --  9 12  GLUCOSE 135*  --   --   --  144* 99  BUN 6  --   --   --  7 6  CREATININE 0.93  --   --   --  0.85 0.95  AST 54*  --   --   --   --   --   ALT 45*  --   --   --   --   --   ALKPHOS 51  --   --   --   --   --   BILITOT 0.7  --   --   --   --   --   ALBUMIN 3.6  --   --   --   --   --   CRP  --   --   --   --   --  4.4*  PROCALCITON  --   --   --  0.14  --  <0.10  LATICACIDVEN  --  2.4* 3.1*  --   --   --   BNP 17.6  --   --   --   --  71.0  MG  --   --   --   --   --  1.9  CALCIUM 9.1  --   --   --  8.9  9.4    Lab Results  Component Value Date   CHOL 288 (H) 01/14/2023   HDL 84 01/14/2023   LDLCALC 170 (H) 01/14/2023   TRIG 169 (H) 01/14/2023   CHOLHDL 3.4 01/14/2023      Recent Labs  Lab 01/20/23 0443 01/20/23 0522 01/20/23 0704 01/20/23 1135 01/21/23 0011 01/22/23 0432  CRP  --   --   --   --   --  4.4*  PROCALCITON  --   --   --  0.14  --  <0.10  LATICACIDVEN  --  2.4* 3.1*  --   --   --   BNP 17.6  --   --   --   --  71.0  MG  --   --   --   --   --  1.9  CALCIUM 9.1  --   --   --  8.9 9.4   DG Chest Port 1 View  Result Date: 01/21/2023 CLINICAL DATA:  Shortness of breath.  Pneumonia. EXAM: PORTABLE CHEST 1 VIEW COMPARISON:  Chest radiographs and CTA 01/20/2023 FINDINGS: The cardiomediastinal silhouette is unchanged with normal heart size. Right lung aeration has improved with decreased patchy opacities in the right lung base. Minimal left basilar opacity may reflect atelectasis. No sizable pleural effusion or pneumothorax is identified. IMPRESSION: Improved right basilar airspace disease. Electronically Signed   By: Sebastian Ache M.D.   On: 01/21/2023 08:19   CT Angio Chest Pulmonary Embolism (PE) W or WO Contrast  Result Date: 01/20/2023 CLINICAL DATA:  Evaluate for pulmonary embolism. High probability. Alcohol detox. EXAM: CT ANGIOGRAPHY CHEST WITH CONTRAST TECHNIQUE:  IMPRESSION: 1. No pulmonary embolism. 2. Diffuse, patchy ground-glass and airspace densities throughout the right lung which is concerning for multifocal infection versus sequelae of aspiration. 3. Diffuse hepatic steatosis. There is hypertrophy of the caudate lobe and lateral segment of left hepatic lobe. Findings are suggestive of cirrhosis. Electronically Signed   By: Signa Kell M.D.   On: 01/20/2023 06:40   DG Chest 2 View  Result Date: 01/20/2023 CLINICAL DATA:  Chest pain.  Alcohol detox EXAM: CHEST - 2 VIEW COMPARISON:  11/17/2021 FINDINGS: Volume loss and hazy density at the right base. No  edema, effusion, or pneumothorax. Normal heart size. IMPRESSION: Right base infiltrate, usually pneumonia. Followup PA and lateral chest X-ray is recommended in 3-4 weeks following trial of antibiotic therapy to ensure resolution. Electronically Signed   By: Tiburcio Pea M.D.   On: 01/20/2023 06:17      Discharge Instructions:  Discharge Instructions     Diet - low sodium heart healthy   Complete by: As directed    Discharge instructions   Complete by: As directed    Follow with Primary MD Diamantina Providence, FNP in 7 days   Get CBC, CMP, 2 view Chest X ray -  checked next visit with your primary MD   Activity: As tolerated with Full fall precautions use walker/cane & assistance as needed  Disposition Home    Diet: Heart Healthy    Special Instructions: If you have  smoked or chewed Tobacco  in the last 2 yrs please stop smoking, stop any regular Alcohol  and or any Recreational drug use.  On your next visit with your primary care physician please Get Medicines reviewed and adjusted.  Please request your Prim.MD to go over all Hospital Tests and Procedure/Radiological results at the follow up, please get all Hospital records sent to your Prim MD by signing hospital release before you go home.  If you experience worsening of your admission symptoms, develop shortness of breath, life threatening emergency, suicidal or homicidal thoughts you must seek medical attention immediately by calling 911 or calling your MD immediately  if symptoms less severe.  You Must read complete instructions/literature along with all the possible adverse reactions/side effects for all the Medicines you take and that have been prescribed to you. Take any new Medicines after you have completely understood and accpet all the possible adverse reactions/side effects.   Do not drive when taking Pain medications.  Do not take more than prescribed Pain, Sleep and Anxiety Medications   Increase activity slowly    Complete by: As directed        Signature  -    Susa Raring M.D on 01/22/2023 at 11:33 AM   -  To page go to [email or web address]

## 2023-01-22 NOTE — Plan of Care (Signed)

## 2023-04-28 ENCOUNTER — Ambulatory Visit: Payer: MEDICAID | Attending: Cardiovascular Disease | Admitting: Cardiovascular Disease

## 2023-04-28 ENCOUNTER — Ambulatory Visit (INDEPENDENT_AMBULATORY_CARE_PROVIDER_SITE_OTHER): Payer: MEDICAID

## 2023-04-28 ENCOUNTER — Encounter: Payer: Self-pay | Admitting: Cardiovascular Disease

## 2023-04-28 DIAGNOSIS — Z8249 Family history of ischemic heart disease and other diseases of the circulatory system: Secondary | ICD-10-CM

## 2023-04-28 DIAGNOSIS — R600 Localized edema: Secondary | ICD-10-CM

## 2023-04-28 DIAGNOSIS — E785 Hyperlipidemia, unspecified: Secondary | ICD-10-CM

## 2023-04-28 DIAGNOSIS — R002 Palpitations: Secondary | ICD-10-CM

## 2023-04-28 DIAGNOSIS — R7401 Elevation of levels of liver transaminase levels: Secondary | ICD-10-CM

## 2023-04-28 DIAGNOSIS — R42 Dizziness and giddiness: Secondary | ICD-10-CM | POA: Diagnosis not present

## 2023-04-28 DIAGNOSIS — R0602 Shortness of breath: Secondary | ICD-10-CM | POA: Diagnosis not present

## 2023-04-28 DIAGNOSIS — F1910 Other psychoactive substance abuse, uncomplicated: Secondary | ICD-10-CM

## 2023-04-28 DIAGNOSIS — R7989 Other specified abnormal findings of blood chemistry: Secondary | ICD-10-CM

## 2023-04-28 NOTE — Progress Notes (Unsigned)
Enrolled patient for a 14 day Zio XT monitor to be maled to patients home

## 2023-04-28 NOTE — Patient Instructions (Addendum)
Medication Instructions:  No changes  *If you need a refill on your cardiac medications before your next appointment, please call your pharmacy*   Lab Work: fasting CMP LIPID Lpa If you have labs (blood work) drawn today and your tests are completely normal, you will receive your results only by: MyChart Message (if you have MyChart) OR A paper copy in the mail If you have any lab test that is abnormal or we need to change your treatment, we will call you to review the results.   Testing/Procedures: Will be schedule at [address] Your physician has requested that you have an echocardiogram. Echocardiography is a painless test that uses sound waves to create images of your heart. It provides your doctor with information about the size and shape of your heart and how well your heart's chambers and valves are working. This procedure takes approximately one hour. There are no restrictions for this procedure. Please do NOT wear cologne, perfume, aftershave, or lotions (deodorant is allowed). Please arrive 15 minutes prior to your appointment time.  Please note: We ask at that you not bring children with you during ultrasound (echo/ vascular) testing. Due to room size and safety concerns, children are not allowed in the ultrasound rooms during exams. Our front office staff cannot provide observation of children in our lobby area while testing is being conducted. An adult accompanying a patient to their appointment will only be allowed in the ultrasound room at the discretion of the ultrasound technician under special circumstances. We apologize for any inconvenience.  And  Will be mailed  to your home 3 to 7  days Your physician has recommended that you wear a holter monitor.- 14  days [employer]  Holter monitors are medical devices that record the heart's electrical activity. Doctors most often use these monitors to diagnose arrhythmias. Arrhythmias are problems with the speed  or rhythm of the heartbeat. The monitor is a small, portable device. You can wear one while you do your normal daily activities. This is usually used to diagnose what is causing palpitations/syncope (passing out).   And  CT coronary calcium score.   Test locations:  [facility]  [place]  [facility]  This is $99 out of pocket.   Coronary CalciumScan A coronary calcium scan is an imaging test used to look for deposits of calcium and other fatty materials (plaques) in the inner lining of the blood vessels of the heart (coronary arteries). These deposits of calcium and plaques can partly clog and narrow the coronary arteries without producing any symptoms or warning signs. This puts a person at risk for a heart attack. This test can detect these deposits before symptoms develop. Tell a health care provider about: Any allergies you have. All medicines you are taking, including vitamins, herbs, eye drops, creams, and over-the-counter medicines. Any problems you or family members have had with anesthetic medicines. Any blood disorders you have. Any surgeries you have had. Any medical conditions you have. Whether you are pregnant or may be pregnant. What are the risks? Generally, this is a safe procedure. However, problems may occur, including: Harm to a pregnant woman and her unborn baby. This test involves the use of radiation. Radiation exposure can be dangerous to a pregnant woman and her unborn baby. If you are pregnant, you generally should not have this procedure done. Slight increase in the risk of cancer. This is because of the  radiation involved in the test. What happens before the procedure? No preparation is needed for this procedure. What happens during the procedure? You will undress and remove any jewelry around your neck or chest. You will put on a hospital gown. Sticky electrodes will be  placed on your chest. The electrodes will be connected to an electrocardiogram (ECG) machine to record a tracing of the electrical activity of your heart. A CT scanner will take pictures of your heart. During this time, you will be asked to lie still and hold your breath for 2-3 seconds while a picture of your heart is being taken. The procedure may vary among health care providers and hospitals. What happens after the procedure? You can get dressed. You can return to your normal activities. It is up to you to get the results of your test. Ask your health care provider, or the department that is doing the test, when your results will be ready. Summary A coronary calcium scan is an imaging test used to look for deposits of calcium and other fatty materials (plaques) in the inner lining of the blood vessels of the heart (coronary arteries). Generally, this is a safe procedure. Tell your health care provider if you are pregnant or may be pregnant. No preparation is needed for this procedure. A CT scanner will take pictures of your heart. You can return to your normal activities after the scan is done. This information is not intended to replace advice given to you by your health care provider. Make sure you discuss any questions you have with your health care provider. Document Released: 12/06/2007 Document Revised: 04/28/2016 Document Reviewed: 04/28/2016 Elsevier Interactive Patient Education  2017 [employer].   And  Follow-Up: At [facility], you and your health needs are our priority.  As part of our continuing mission to provide you with exceptional heart care, we have created designated Provider Care Teams.  These Care Teams include your primary Cardiologist (physician) and Advanced Practice Providers (APPs -  Physician Assistants and Nurse Practitioners) who all work together to provide you with the care you need, when you need it.     Your next appointment:   3 month(s)  The  format for your next appointment:   In Person  Provider:   Nicki Guadalajara, MD    Other Instructions   Christena Deem- Long Term Monitor Instructions  Your physician has requested you wear a ZIO patch monitor for 14 days.  This is a single patch monitor. [employer] supplies one patch monitor per enrollment. Additional stickers are not available. Please do not apply patch if you will be having a Nuclear Stress Test,  Echocardiogram, Cardiac CT, MRI, or Chest Xray during the period you would be wearing the  monitor. The patch cannot be worn during these tests. You cannot remove and re-apply the  ZIO XT patch monitor.  Your ZIO patch monitor will be mailed 3 day USPS to your address on file. It may take 3-5 days  to receive your monitor after you have been enrolled.  Once you have received your monitor, please review the enclosed instructions. Your monitor  has already been registered assigning a specific monitor serial # to you.  Billing and Patient Assistance Program Information  We have supplied [employer] with any of your insurance information on file for billing purposes. [employer] offers a sliding scale Patient Assistance Program for patients that do not have  insurance, or whose insurance does not completely cover the cost of the ZIO  monitor.  You must apply for the Patient Assistance Program to qualify for this discounted rate.  To apply, please call [employer] at [phone], select option 4, select option 2, ask to apply for  Patient Assistance Program. Meredeth Ide will ask your household income, and how many people  are in your household. They will quote your out-of-pocket cost based on that information.  [employer] will also be able to set up a 26-month, interest-free payment plan if needed.  Applying the monitor   Shave hair from upper left chest.  Hold abrader disc by orange tab. Rub abrader in 40 strokes over the upper left chest as  indicated in your monitor instructions.  Clean area with 4  enclosed alcohol pads. Let dry.  Apply patch as indicated in monitor instructions. Patch will be placed under collarbone on left  side of chest with arrow pointing upward.  Rub patch adhesive wings for 2 minutes. Remove white label marked "1". Remove the white  label marked "2". Rub patch adhesive wings for 2 additional minutes.  While looking in a mirror, press and release button in center of patch. A small green light will  flash 3-4 times. This will be your only indicator that the monitor has been turned on.  Do not shower for the first 24 hours. You may shower after the first 24 hours.  Press the button if you feel a symptom. You will hear a small click. Record Date, Time and  Symptom in the Patient Logbook.  When you are ready to remove the patch, follow instructions on the last 2 pages of Patient  Logbook. Stick patch monitor onto the last page of Patient Logbook.  Place Patient Logbook in the blue and white box. Use locking tab on box and tape box closed  securely. The blue and white box has prepaid postage on it. Please place it in the mailbox as  soon as possible. Your physician should have your test results approximately 7 days after the  monitor has been mailed back to [facility].  Call [facility] Customer Care at [phone] if you have questions regarding  your ZIO XT patch monitor. Call them immediately if you see an orange light blinking on your  monitor.  If your monitor falls off in less than 4 days, contact our Monitor department at [phone].  If your monitor becomes loose or falls off after 4 days call [employer] at [phone] for  suggestions on securing your monitor

## 2023-04-28 NOTE — Progress Notes (Signed)
Cardiology Office Note    Date:  05/04/2023   ID:  Eric, Nolan 1981-01-06, MRN [id number]  PCP:  Diamantina Providence, FNP  Cardiologist:  Nicki Guadalajara, MD   Chief Complaint  Patient presents with   New Patient (Initial Visit)   Headache   Shortness of Breath   Edema    Feet and ankles.    New cardiology consultation referred by Melford Aase, is NP for evaluation of shortness of breath, dizziness and palpitations.  History of Present Illness:  Eric Nolan is a 42 y.o. male who is originally from [place] and has lived in [place] for approximately 6 years.  He see Melford Aase, FNP for primary care and recently has experienced episodes of shortness of breath, dizziness, palpitations, and swelling of his feet and ankles.  He had been hospitalized  on July 30 and discharged on January 22, 2023 when he presented with acute hypoxic respiratory failure in the setting of an aspiration event.  There was concern for aspiration pneumonia versus pneumonitis and he was treated with empiric antibiotics with Augmentin.  He had mild LFT elevation and transaminitis was felt consistent with alcohol abuse.  During that hospitalization, CT of his chest did not show any evidence for pulmonary embolism.  There was diffuse patchy groundglass and airspace densities throughout the right lung concerning for multifocal infection versus sequelae of aspiration.  There was diffuse hepatic steatosis with hypertrophy of the caudate lobe and lateral segment of the left hepatic lobe concerning for possible cirrhosis.  He has been followed by his primary physician.  He presents today for evaluation of shortness of breath dizziness and ankle swelling.  He admits to palpitations which may occur several times a day.  He also tells me he has a history of prior addiction to opiates.  His father had undergone valve replacement.  Prior to undergoing alcoholic detoxification with behavioral health  he was drinking at least a gallon of alcohol every 3 days for over 3 years.  Following behavioral health detoxification he went to rehabilitation.  He admits to frequent daily panic attacks.  Current medications include Lexapro and bupropion for depression.  He is on Suboxone for his opioid addiction.  He is on HCTZ 12.5 mg and takes propranolol 10 mg twice a day.  He takes Neurontin for neuropathy and folic acid supplementation.  He takes as needed albuterol.  He presents for initial consultation and evaluation.   Past Medical History:  Diagnosis Date   Depression    Gallstones     History reviewed. No pertinent surgical history.  Current Medications: Outpatient Medications Prior to Visit  Medication Sig Dispense Refill   albuterol (VENTOLIN HFA) 108 (90 Base) MCG/ACT inhaler Inhale 2 puffs into the lungs every 6 (six) hours as needed for wheezing or shortness of breath. 8 g 2   alum & mag hydroxide-simeth (MAALOX/MYLANTA) 200-200-20 MG/5ML suspension Take 30 mLs by mouth every 4 (four) hours as needed for indigestion. 355 mL 0   buprenorphine-naloxone (SUBOXONE) 8-2 mg SUBL SL tablet Place 1 tablet under the tongue 2 (two) times daily.     buPROPion (WELLBUTRIN XL) 300 MG 24 hr tablet Take 300 mg by mouth every morning.     escitalopram (LEXAPRO) 10 MG tablet Take 10 mg by mouth daily.     folic acid (FOLVITE) 1 MG tablet Take 1 tablet by mouth daily.     gabapentin (NEURONTIN) 300 MG capsule Take 1  capsule (300 mg total) by mouth 2 (two) times daily. 60 capsule 0   hydrochlorothiazide (HYDRODIURIL) 12.5 MG tablet Take 12.5 mg by mouth daily.     propranolol (INDERAL) 10 MG tablet Take 10 mg by mouth 2 (two) times daily.     tadalafil (CIALIS) 10 MG tablet Take 10 mg by mouth daily as needed for erectile dysfunction.     thiamine (VITAMIN B1) 100 MG tablet Take 1 tablet (100 mg total) by mouth daily. 30 tablet 0   acetaminophen (TYLENOL) 325 MG tablet Take 2 tablets (650 mg total) by mouth  3 (three) times daily at 8am, 2pm and bedtime. 14 tablet 0   amoxicillin-clavulanate (AUGMENTIN) 875-125 MG tablet Take 1 tablet by mouth 2 (two) times daily. 10 tablet 0   fluticasone (FLOVENT HFA) 110 MCG/ACT inhaler Inhale 2 puffs into the lungs 2 (two) times daily. (Patient taking differently: Inhale 2 puffs into the lungs 2 (two) times daily as needed (For shortness of breath).) 1 each 12   omeprazole (PRILOSEC) 20 MG capsule Take 2 capsules (40 mg total) by mouth daily. (Patient taking differently: Take 20 mg by mouth 2 (two) times daily before a meal.) 60 capsule 0   No facility-administered medications prior to visit.     Allergies:   Patient has no known allergies.   Social History   Socioeconomic History   Marital status: Single    Spouse name: Not on file   Number of children: Not on file   Years of education: Not on file   Highest education level: Not on file  Occupational History   Not on file  Tobacco Use   Smoking status: Former    Current packs/day: 0.00    Average packs/day: 1 pack/day for 5.2 years (5.2 ttl pk-yrs)    Types: Cigarettes    Start date: 04/21/2000    Quit date: 06/23/2005    Years since quitting: 17.8   Smokeless tobacco: Never  Vaping Use   Vaping status: Never Used  Substance and Sexual Activity   Alcohol use: Yes    Alcohol/week: 70.0 standard drinks of alcohol    Types: 70 Standard drinks or equivalent per week    Comment: 5-10 drinks per day   Drug use: Yes   Sexual activity: Yes  Other Topics Concern   Not on file  Social History Narrative   Not on file   Social Determinants of Health   Financial Resource Strain: Not on file  Food Insecurity: No Food Insecurity (01/17/2023)   Hunger Vital Sign    Worried About Running Out of Food in the Last Year: Never true    Ran Out of Food in the Last Year: Never true  Transportation Needs: No Transportation Needs (01/17/2023)   PRAPARE - Administrator, Civil Service (Medical): No     Lack of Transportation (Non-Medical): No  Physical Activity: Not on file  Stress: Not on file  Social Connections: Unknown (11/05/2021)   Received from [facility]   Social Network    Social Network: Not on file     Social history is notable that he was born in [place].  He has been in [place] for the last 5 to 6 years.  He is currently single and in his words "hopefully forever."He does not have children.  He has history of opiate addiction and significant alcohol use.  He underwent detoxification.  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  He smokes cigarettes for 60 years, quit in 2008.  He quit drinking in August 2024.  He does not exercise because he is "lazy and depressed."  Family History:  The patient's family history includes Alzheimer's disease in his maternal grandmother; Drug abuse in his brother; Gout in his father; Heart attack in his paternal grandfather; Stroke in his paternal grandfather; Valvular heart disease in his father.   ROS General: Negative; No fevers, chills, or night sweats;  HEENT: Negative; No changes in vision or hearing, sinus congestion, difficulty swallowing Pulmonary: Negative; No cough, wheezing, shortness of breath, hemoptysis Cardiovascular: See HPI GI: Negative; No nausea, vomiting, diarrhea, or abdominal pain GU: Negative; No dysuria, hematuria, or difficulty voiding Musculoskeletal: Negative; no myalgias, joint pain, or weakness Hematologic/Oncology: Negative; no easy bruising, bleeding Endocrine: Negative; no heat/cold intolerance; no diabetes Neuro: Negative; no changes in balance, headaches Skin: Negative; No rashes or skin lesions Psychiatric: depression Sleep: Negative; No snoring, daytime sleepiness, hypersomnolence, bruxism, restless legs, hypnogognic hallucinations, no cataplexy Other comprehensive 14 point system review is  negative.   PHYSICAL EXAM:   VS:  BP 104/78 (BP Location: Right Arm, Patient Position: Sitting, Cuff Size: Normal)   Pulse 73   Ht 5\' 10"  (1.778 m)   Wt 202 lb (91.6 kg)   BMI 28.98 kg/m     Repeat blood pressure by me was 106/74 supine and 112/78 standing.  Wt Readings from Last 3 Encounters:  04/28/23 202 lb (91.6 kg)  01/20/23 215 lb (97.5 kg)  11/17/21 200 lb (90.7 kg)    General: Alert, oriented, no distress.  Skin: normal turgor, no rashes, warm and dry HEENT: Normocephalic, atraumatic. Pupils equal round and reactive to light; sclera anicteric; extraocular muscles intact;  Nose without nasal septal hypertrophy Mouth/Parynx benign; Mallinpatti scale 2/3 Neck: No JVD, no carotid bruits; normal carotid upstroke Lungs: clear to ausculatation and percussion; no wheezing or rales Chest wall: without tenderness to palpitation Heart: PMI not displaced, RRR, s1 s2 normal, 1/6 systolic murmur, no diastolic murmur, no rubs, gallops, thrills, or heaves Abdomen: soft, nontender; no hepatosplenomehaly, BS+; abdominal aorta nontender and not dilated by palpation. Back: no CVA tenderness Pulses 2+ Musculoskeletal: full range of motion, normal strength, no joint deformities Extremities: no clubbing cyanosis or edema, Homan's sign negative  Neurologic: grossly nonfocal; Cranial nerves grossly wnl Psychologic: Normal mood and affect   Studies/Labs Reviewed:    EKG Interpretation Date/Time:  Tuesday April 28 2023 11:12:57 EST Ventricular Rate:  73 PR Interval:  136 QRS Duration:  86 QT Interval:  404 QTC Calculation: 445 R Axis:   41  Text Interpretation: Normal sinus rhythm Normal ECG When compared with ECG of 20-Jan-2023 04:36, PREVIOUS ECG IS PRESENT Confirmed by Nicki Guadalajara ([id number]) on 04/28/2023 11:47:58 AM    Recent Labs:    Latest Ref Rng & Units 01/22/2023    4:32 AM 01/21/2023   12:11 AM 01/20/2023    4:43 AM  BMP  Glucose 70 - 99 mg/dL 99  604  540   BUN 6 - 20  mg/dL 6  7  6    Creatinine 0.61 - 1.24 mg/dL 9.81  1.91  4.78   Sodium 135 - 145 mmol/L 138  137  137   Potassium 3.5 - 5.1 mmol/L 4.2  3.7  4.3   Chloride 98 - 111 mmol/L 98  101  98   CO2 22 - 32 mmol/L 28  27  26   Calcium 8.9 - 10.3 mg/dL 9.4  8.9  9.1         Latest Ref Rng & Units 01/20/2023    4:43 AM 01/14/2023    2:50 PM 11/17/2021    7:12 AM  Hepatic Function  Total Protein 6.5 - 8.1 g/dL 6.1  6.7  7.2   Albumin 3.5 - 5.0 g/dL 3.6  4.0  4.1   AST 15 - 41 U/L 54  160  35   ALT 0 - 44 U/L 45  71  34   Alk Phosphatase 38 - 126 U/L 51  65  55   Total Bilirubin 0.3 - 1.2 mg/dL 0.7  0.7  0.8        Latest Ref Rng & Units 01/22/2023    4:32 AM 01/21/2023   12:11 AM 01/20/2023    4:43 AM  CBC  WBC 4.0 - 10.5 K/uL 8.1  9.7  9.3   Hemoglobin 13.0 - 17.0 g/dL 82.9  56.2  13.0   Hematocrit 39.0 - 52.0 % 42.1  41.8  46.4   Platelets 150 - 400 K/uL 219  196  209    Lab Results  Component Value Date   MCV 95.9 01/22/2023   MCV 96.1 01/21/2023   MCV 96.9 01/20/2023   Lab Results  Component Value Date   TSH 2.594 01/14/2023   Lab Results  Component Value Date   HGBA1C 5.5 01/14/2023     BNP    Component Value Date/Time   BNP 71.0 01/22/2023 0432    ProBNP No results found for: "PROBNP"   Lipid Panel     Component Value Date/Time   CHOL 288 (H) 01/14/2023 1450   TRIG 169 (H) 01/14/2023 1450   HDL 84 01/14/2023 1450   CHOLHDL 3.4 01/14/2023 1450   VLDL 34 01/14/2023 1450   LDLCALC 170 (H) 01/14/2023 1450     RADIOLOGY: No results found.   Additional studies/ records that were reviewed today include:  I reviewed his prior ER records and laboratory   ASSESSMENT:    1. Shortness of breath   2. Dizziness   3. Lower extremity edema   4. Palpitations   5. Elevated LFTs   6. Family history of coronary artery disease   7. Hyperlipidemia LDL goal <70   8. Polysubstance abuse [facility])     PLAN:  Mr. Caton Tefft is a 42 year old male who has a  significant history of prior opiate addiction, as well as excessive alcohol use with often drinking over a gallon of liquor every 3 days.  He ultimately completely quit alcohol and underwent detoxification with behavioral health and subsequent rehabilitation since August 2024.  He has family history of his mother suffering a TIA and his father having undergone valve surgery and also had a TIA.  There are family members who also have had heart attacks and strokes.  He had 1 brother who died of an overdose at age 67.  Presently, he has experienced palpitations, lightheadedness, dizziness, some shortness of breath, and intermittently has experience ankle and feet swelling.  Presently, I am scheduling him to undergo a 2D echo Doppler study to evaluate systolic and diastolic function as well as valvular architecture.  I am scheduling him a 2-week Zio patch monitor to assess his episodic palpitations.  Remotely, he had elevation of liver transaminases and there was some concern for possible hepatic cirrhosis.  I am recommending initial comprehensive laboratory be obtained with a c-Met,  fasting lipid panel, and LP(a).  I reviewed his most recent laboratory from July/August  where his ALT was 45 and AST 54.  Lipid panel from January 14, 2023 showed marked hyperlipidemia with total cholesterol 288, triglycerides 169, HDL 84, and LDL 170.  At that time he was still drinking excessive alcohol.  Currently, he is on Lexapro and bupropion for depression and continues to be on Suboxone with his prior opiate addiction.  He has been taking low-dose propranolol 10 mg twice a day and HCTZ 12.5 mg for his leg swelling.  With his recent elevated lipid studies and hepatic transaminase elevation, I will schedule him for coronary calcium score to assess for coronary calcification.  Pending follow-up laboratory I will not initiate lipid-lowering therapy presently.  I will contact him regarding the results of the above studies and plan  follow-up evaluation in 2 to 3 months.   Medication Adjustments/Labs and Tests Ordered: Current medicines are reviewed at length with the patient today.  Concerns regarding medicines are outlined above.  Medication changes, Labs and Tests ordered today are listed in the Patient Instructions below. Patient Instructions  Medication Instructions:  No changes  *If you need a refill on your cardiac medications before your next appointment, please call your pharmacy*   Lab Work: fasting CMP LIPID Lpa If you have labs (blood work) drawn today and your tests are completely normal, you will receive your results only by: MyChart Message (if you have MyChart) OR A paper copy in the mail If you have any lab test that is abnormal or we need to change your treatment, we will call you to review the results.   Testing/Procedures: Will be schedule at [address] Your physician has requested that you have an echocardiogram. Echocardiography is a painless test that uses sound waves to create images of your heart. It provides your doctor with information about the size and shape of your heart and how well your heart's chambers and valves are working. This procedure takes approximately one hour. There are no restrictions for this procedure. Please do NOT wear cologne, perfume, aftershave, or lotions (deodorant is allowed). Please arrive 15 minutes prior to your appointment time.  Please note: We ask at that you not bring children with you during ultrasound (echo/ vascular) testing. Due to room size and safety concerns, children are not allowed in the ultrasound rooms during exams. Our front office staff cannot provide observation of children in our lobby area while testing is being conducted. An adult accompanying a patient to their appointment will only be allowed in the ultrasound room at the discretion of the ultrasound technician under special circumstances. We apologize for any  inconvenience.  And  Will be mailed  to your home 3 to 7  days Your physician has recommended that you wear a holter monitor.- 14  days Zio  Holter monitors are medical devices that record the heart's electrical activity. Doctors most often use these monitors to diagnose arrhythmias. Arrhythmias are problems with the speed or rhythm of the heartbeat. The monitor is a small, portable device. You can wear one while you do your normal daily activities. This is usually used to diagnose what is causing palpitations/syncope (passing out).   And  CT coronary calcium score.   Test locations:  [facility]  [facility]  This is $99 out of pocket.   Coronary CalciumScan A coronary calcium scan is  an imaging test used to look for deposits of calcium and other fatty materials (plaques) in the inner lining of the blood vessels of the heart (coronary arteries). These deposits of calcium and plaques can partly clog and narrow the coronary arteries without producing any symptoms or warning signs. This puts a person at risk for a heart attack. This test can detect these deposits before symptoms develop. Tell a health care provider about: Any allergies you have. All medicines you are taking, including vitamins, herbs, eye drops, creams, and over-the-counter medicines. Any problems you or family members have had with anesthetic medicines. Any blood disorders you have. Any surgeries you have had. Any medical conditions you have. Whether you are pregnant or may be pregnant. What are the risks? Generally, this is a safe procedure. However, problems may occur, including: Harm to a pregnant woman and her unborn baby. This test involves the use of radiation. Radiation exposure can be dangerous to a pregnant woman and her unborn baby. If you are pregnant, you generally should not have this procedure done. Slight increase  in the risk of cancer. This is because of the radiation involved in the test. What happens before the procedure? No preparation is needed for this procedure. What happens during the procedure? You will undress and remove any jewelry around your neck or chest. You will put on a hospital gown. Sticky electrodes will be placed on your chest. The electrodes will be connected to an electrocardiogram (ECG) machine to record a tracing of the electrical activity of your heart. A CT scanner will take pictures of your heart. During this time, you will be asked to lie still and hold your breath for 2-3 seconds while a picture of your heart is being taken. The procedure may vary among health care providers and hospitals. What happens after the procedure? You can get dressed. You can return to your normal activities. It is up to you to get the results of your test. Ask your health care provider, or the department that is doing the test, when your results will be ready. Summary A coronary calcium scan is an imaging test used to look for deposits of calcium and other fatty materials (plaques) in the inner lining of the blood vessels of the heart (coronary arteries). Generally, this is a safe procedure. Tell your health care provider if you are pregnant or may be pregnant. No preparation is needed for this procedure. A CT scanner will take pictures of your heart. You can return to your normal activities after the scan is done. This information is not intended to replace advice given to you by your health care provider. Make sure you discuss any questions you have with your health care provider. Document Released: 12/06/2007 Document Revised: 04/28/2016 Document Reviewed: 04/28/2016 Elsevier Interactive Patient Education  2017 [employer].   And  Follow-Up: At [facility], you and your health needs are our priority.  As part of our continuing mission to provide you with exceptional heart care, we have  created designated Provider Care Teams.  These Care Teams include your primary Cardiologist (physician) and Advanced Practice Providers (APPs -  Physician Assistants and Nurse Practitioners) who all work together to provide you with the care you need, when you need it.     Your next appointment:   3 month(s)  The format for your next appointment:   In Person  Provider:   Nicki Guadalajara, MD    Other Instructions   ZIO XT- Long Term Monitor  Instructions  Your physician has requested you wear a ZIO patch monitor for 14 days.  This is a single patch monitor. [employer] supplies one patch monitor per enrollment. Additional stickers are not available. Please do not apply patch if you will be having a Nuclear Stress Test,  Echocardiogram, Cardiac CT, MRI, or Chest Xray during the period you would be wearing the  monitor. The patch cannot be worn during these tests. You cannot remove and re-apply the  ZIO XT patch monitor.  Your ZIO patch monitor will be mailed 3 day USPS to your address on file. It may take 3-5 days  to receive your monitor after you have been enrolled.  Once you have received your monitor, please review the enclosed instructions. Your monitor  has already been registered assigning a specific monitor serial # to you.  Billing and Patient Assistance Program Information  We have supplied [employer] with any of your insurance information on file for billing purposes. [employer] offers a sliding scale Patient Assistance Program for patients that do not have  insurance, or whose insurance does not completely cover the cost of the ZIO monitor.  You must apply for the Patient Assistance Program to qualify for this discounted rate.  To apply, please call [employer] at [phone], select option 4, select option 2, ask to apply for  Patient Assistance Program. Meredeth Ide will ask your household income, and how many people  are in your household. They will quote your out-of-pocket cost based on  that information.  [employer] will also be able to set up a 24-month, interest-free payment plan if needed.  Applying the monitor   Shave hair from upper left chest.  Hold abrader disc by orange tab. Rub abrader in 40 strokes over the upper left chest as  indicated in your monitor instructions.  Clean area with 4 enclosed alcohol pads. Let dry.  Apply patch as indicated in monitor instructions. Patch will be placed under collarbone on left  side of chest with arrow pointing upward.  Rub patch adhesive wings for 2 minutes. Remove white label marked "1". Remove the white  label marked "2". Rub patch adhesive wings for 2 additional minutes.  While looking in a mirror, press and release button in center of patch. A small green light will  flash 3-4 times. This will be your only indicator that the monitor has been turned on.  Do not shower for the first 24 hours. You may shower after the first 24 hours.  Press the button if you feel a symptom. You will hear a small click. Record Date, Time and  Symptom in the Patient Logbook.  When you are ready to remove the patch, follow instructions on the last 2 pages of Patient  Logbook. Stick patch monitor onto the last page of Patient Logbook.  Place Patient Logbook in the blue and white box. Use locking tab on box and tape box closed  securely. The blue and white box has prepaid postage on it. Please place it in the mailbox as  soon as possible. Your physician should have your test results approximately 7 days after the  monitor has been mailed back to [facility].  Call [facility] Customer Care at [phone] if you have questions regarding  your ZIO XT patch monitor. Call them immediately if you see an orange light blinking on your  monitor.  If your monitor falls off in less than 4 days, contact our Monitor department at [phone].  If your monitor becomes loose or falls  off after 4 days call [employer] at [phone] for  suggestions on  securing your monitor    Signed, Nicki Guadalajara, MD  05/04/2023 9:34 AM    [facility] [address], [address], [place], [place]  [address] Phone: [phone]

## 2023-05-04 ENCOUNTER — Encounter: Payer: Self-pay | Admitting: Cardiovascular Disease

## 2023-06-08 ENCOUNTER — Ambulatory Visit (HOSPITAL_COMMUNITY): Payer: MEDICAID | Attending: Cardiovascular Disease

## 2023-06-08 DIAGNOSIS — Z8249 Family history of ischemic heart disease and other diseases of the circulatory system: Secondary | ICD-10-CM | POA: Insufficient documentation

## 2023-06-08 DIAGNOSIS — R002 Palpitations: Secondary | ICD-10-CM | POA: Diagnosis present

## 2023-06-08 DIAGNOSIS — R7989 Other specified abnormal findings of blood chemistry: Secondary | ICD-10-CM | POA: Diagnosis present

## 2023-06-08 DIAGNOSIS — R011 Cardiac murmur, unspecified: Secondary | ICD-10-CM | POA: Diagnosis not present

## 2023-06-08 LAB — ECHOCARDIOGRAM COMPLETE
Area-P 1/2: 3.37 cm2
S' Lateral: 2.8 cm

## 2023-06-09 ENCOUNTER — Ambulatory Visit (HOSPITAL_BASED_OUTPATIENT_CLINIC_OR_DEPARTMENT_OTHER)
Admission: RE | Admit: 2023-06-09 | Discharge: 2023-06-09 | Disposition: A | Discharge status: Home / Self Care | Payer: Self-pay | Source: Ambulatory Visit | Attending: Cardiovascular Disease | Admitting: Cardiovascular Disease

## 2023-06-09 DIAGNOSIS — R002 Palpitations: Secondary | ICD-10-CM | POA: Insufficient documentation

## 2023-06-09 DIAGNOSIS — R7989 Other specified abnormal findings of blood chemistry: Secondary | ICD-10-CM | POA: Insufficient documentation

## 2023-06-09 DIAGNOSIS — Z8249 Family history of ischemic heart disease and other diseases of the circulatory system: Secondary | ICD-10-CM | POA: Insufficient documentation

## 2023-06-15 DIAGNOSIS — Z8249 Family history of ischemic heart disease and other diseases of the circulatory system: Secondary | ICD-10-CM

## 2023-06-15 DIAGNOSIS — R002 Palpitations: Secondary | ICD-10-CM | POA: Diagnosis not present

## 2023-08-05 ENCOUNTER — Ambulatory Visit: Payer: MEDICAID | Admitting: Cardiovascular Disease

## 2023-08-17 ENCOUNTER — Encounter: Payer: Self-pay | Admitting: Cardiovascular Disease

## 2023-08-17 ENCOUNTER — Ambulatory Visit: Payer: MEDICAID | Attending: Cardiovascular Disease | Admitting: Cardiovascular Disease

## 2023-08-17 DIAGNOSIS — R600 Localized edema: Secondary | ICD-10-CM | POA: Diagnosis not present

## 2023-08-17 DIAGNOSIS — R002 Palpitations: Secondary | ICD-10-CM

## 2023-08-17 DIAGNOSIS — R0602 Shortness of breath: Secondary | ICD-10-CM | POA: Diagnosis not present

## 2023-08-17 DIAGNOSIS — Z8249 Family history of ischemic heart disease and other diseases of the circulatory system: Secondary | ICD-10-CM | POA: Diagnosis not present

## 2023-08-17 DIAGNOSIS — F1910 Other psychoactive substance abuse, uncomplicated: Secondary | ICD-10-CM

## 2023-08-17 DIAGNOSIS — R7989 Other specified abnormal findings of blood chemistry: Secondary | ICD-10-CM

## 2023-08-17 NOTE — Progress Notes (Unsigned)
 Cardiology Office Note    Date:  08/19/2023   ID:  [name], [name] 05/08/1981, MRN [id number]  PCP:  Diamantina Providence, FNP  Cardiologist:  Nicki Guadalajara, MD   No chief complaint on file.   3 month F/U cardiology evaluation initially referred by Melford Aase, NP for evaluation of shortness of breath, dizziness and palpitations.  History of Present Illness:  Eric Nolan is a 43 y.o. male who is originally from [place] and has lived in [place] for approximately 6 years.  He see Melford Aase, FNP for primary care and recently has experienced episodes of shortness of breath, dizziness, palpitations, and swelling of his feet and ankles.  He had been hospitalized  on July 30 and discharged on January 22, 2023 when he presented with acute hypoxic respiratory failure in the setting of an aspiration event.  There was concern for aspiration pneumonia versus pneumonitis and he was treated with empiric antibiotics with Augmentin.  He had mild LFT elevation and transaminitis was felt consistent with alcohol abuse.  During that hospitalization, CT of his chest did not show any evidence for pulmonary embolism.  There was diffuse patchy groundglass and airspace densities throughout the right lung concerning for multifocal infection versus sequelae of aspiration.  There was diffuse hepatic steatosis with hypertrophy of the caudate lobe and lateral segment of the left hepatic lobe concerning for possible cirrhosis.  He has been followed by his primary physician.  I saw him for my initial cardiology consultation on April 28, 2023 when he presented to the office with complaints of  shortness of breath, dizziness and ankle swelling.  He admits to palpitations which may occur several times a day.  He also tells me he has a history of prior addiction to opiates.  His father had undergone valve replacement.  Prior to undergoing alcoholic detoxification with behavioral health he was  drinking at least a gallon of alcohol every 3 days for over 3 years.  Following behavioral health detoxification he went to rehabilitation.  He admits to frequent daily panic attacks.  Current medications include Lexapro and bupropion for depression.  He is on Suboxone for his opioid addiction.  He is on HCTZ 12.5 mg and takes propranolol 10 mg twice a day.  He takes Neurontin for neuropathy and folic acid supplementation.  He takes as needed albuterol.    During his initial evaluation I had an extensive discussion with him.  I recommended he undergo a 2D echo Doppler study to evaluate systolic and diastolic function as well as valvular architecture and schedule him for a 2-week Zio patch monitor to assess his episodic palpitations.  Remotely he had significant liver transaminase elevation with concern for possible hepatic cirrhosis.  Comprehensive laboratory was recommended.  He was on Lexapro and bupropion for depression and continued to be on Suboxone for his prior opiate addiction.  He was taking low-dose propranolol 10 mg twice a day and HCTZ 12.5 mg for leg swelling.  I scheduled him for a coronary calcium score particularly with his elevated lipid studies.  Presently, Mr. Kinnear feels well.  He has been sober now for 6 months.  He is undergoing testosterone injections 2 times per month.  He also is on supplemental vitamin D.  His echo Doppler study from June 08, 2023 showed normal LV function with EF 55 to 60%, normal strain pattern and normal valves.  CT cardiac scoring performed on June 09, 2023 did not show  any acute extracardiac findings.  There was evidence for small hiatal hernia.  Calcium score was excellent at 0.  Zio patch monitor predominant sinus rhythm with average heart rate at 82 bpm.  1 brief run of SVT lasting 4 beats with an average rate of 145.  He had rare isolated atrial ectopy and rare isolated PVCs.  There was transient ventricular trigeminal rhythm.  He denies any chest  pain or shortness of breath.  He feels significantly better with his detoxification.  He sees Melford Aase, NP rechecking laboratory.  He ontinues to be on Lexapro and bupropion.  He is now only taking propranolol on an as needed basis.    Past Medical History:  Diagnosis Date   Depression    Gallstones     History reviewed. No pertinent surgical history.  Current Medications: Outpatient Medications Prior to Visit  Medication Sig Dispense Refill   buprenorphine-naloxone (SUBOXONE) 8-2 mg SUBL SL tablet Place 1 tablet under the tongue 2 (two) times daily.     buPROPion (WELLBUTRIN XL) 300 MG 24 hr tablet Take 300 mg by mouth every morning.     escitalopram (LEXAPRO) 10 MG tablet Take 10 mg by mouth daily.     folic acid (FOLVITE) 1 MG tablet Take 1 tablet by mouth daily.     gabapentin (NEURONTIN) 300 MG capsule Take 1 capsule (300 mg total) by mouth 2 (two) times daily. 60 capsule 0   tadalafil (CIALIS) 10 MG tablet Take 10 mg by mouth daily as needed for erectile dysfunction.     albuterol (VENTOLIN HFA) 108 (90 Base) MCG/ACT inhaler Inhale 2 puffs into the lungs every 6 (six) hours as needed for wheezing or shortness of breath. (Patient not taking: Reported on 08/17/2023) 8 g 2   alum & mag hydroxide-simeth (MAALOX/MYLANTA) 200-200-20 MG/5ML suspension Take 30 mLs by mouth every 4 (four) hours as needed for indigestion. (Patient not taking: Reported on 08/17/2023) 355 mL 0   hydrochlorothiazide (HYDRODIURIL) 12.5 MG tablet Take 12.5 mg by mouth daily. (Patient not taking: Reported on 08/17/2023)     omeprazole (PRILOSEC) 20 MG capsule Take 2 capsules (40 mg total) by mouth daily. (Patient taking differently: Take 20 mg by mouth 2 (two) times daily before a meal.) 60 capsule 0   propranolol (INDERAL) 10 MG tablet Take 10 mg by mouth 2 (two) times daily. (Patient not taking: Reported on 08/17/2023)     thiamine (VITAMIN B1) 100 MG tablet Take 1 tablet (100 mg total) by mouth daily. (Patient  not taking: Reported on 08/17/2023) 30 tablet 0   No facility-administered medications prior to visit.     Allergies:   Patient has no known allergies.   Social History   Socioeconomic History   Marital status: Single    Spouse name: Not on file   Number of children: Not on file   Years of education: Not on file   Highest education level: Not on file  Occupational History   Not on file  Tobacco Use   Smoking status: Former    Current packs/day: 0.00    Average packs/day: 1 pack/day for 5.2 years (5.2 ttl pk-yrs)    Types: Cigarettes    Start date: 04/21/2000    Quit date: 06/23/2005    Years since quitting: 18.1   Smokeless tobacco: Never  Vaping Use   Vaping status: Never Used  Substance and Sexual Activity   Alcohol use: Not Currently    Alcohol/week: 70.0 standard drinks of alcohol  Types: 70 Standard drinks or equivalent per week    Comment: 5-10 drinks per day   Drug use: Not Currently   Sexual activity: Not Currently    Partners: Female    Comment: single  Other Topics Concern   Not on file  Social History Narrative   Not on file   Social Drivers of Health   Financial Resource Strain: Not on file  Food Insecurity: No Food Insecurity (01/17/2023)   Hunger Vital Sign    Worried About Running Out of Food in the Last Year: Never true    Ran Out of Food in the Last Year: Never true  Transportation Needs: No Transportation Needs (01/17/2023)   PRAPARE - Administrator, Civil Service (Medical): No    Lack of Transportation (Non-Medical): No  Physical Activity: Not on file  Stress: Not on file  Social Connections: Unknown (11/05/2021)   [facility]   Social Network    Social Network: Not on file     Social history is notable that he was born in [place].  He graduated from [employer] in 2007.  He has been in [place] for the last 5 to 6 years.  He is currently single and in his words "hopefully forever."He does  not have children.  He has history of opiate addiction and significant alcohol use.  He underwent detoxification.  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  He smokes cigarettes for 60 years, quit in 2008.  He quit drinking in August 2024.  He does not exercise because he is "lazy and depressed."  Family History:  The patient's family history includes Alzheimer's disease in his maternal grandmother; Drug abuse in his brother; Gout in his father; Heart attack in his paternal grandfather; Stroke in his paternal grandfather; Valvular heart disease in his father.   ROS General: Negative; No fevers, chills, or night sweats;  HEENT: Negative; No changes in vision or hearing, sinus congestion, difficulty swallowing Pulmonary: Negative; No cough, wheezing, shortness of breath, hemoptysis Cardiovascular: See HPI GI: Negative; No nausea, vomiting, diarrhea, or abdominal pain GU: Negative; No dysuria, hematuria, or difficulty voiding Musculoskeletal: Negative; no myalgias, joint pain, or weakness Hematologic/Oncology: Negative; no easy bruising, bleeding Endocrine: Negative; no heat/cold intolerance; no diabetes Neuro: Negative; no changes in balance, headaches Skin: Negative; No rashes or skin lesions Psychiatric: depression Sleep: Negative; No snoring, daytime sleepiness, hypersomnolence, bruxism, restless legs, hypnogognic hallucinations, no cataplexy Other comprehensive 14 point system review is negative.   PHYSICAL EXAM:   VS:  BP 119/75   Pulse 75   Ht 5\' 10"  (1.778 m)   Wt 211 lb 6.4 oz (95.9 kg)   SpO2 99%   BMI 30.33 kg/m     Repeat blood pressure by me was 110/70.  Wt Readings from Last 3 Encounters:  08/17/23 211 lb 6.4 oz (95.9 kg)  04/28/23 202 lb (91.6 kg)  01/20/23 215 lb (97.5 kg)    General: Alert, oriented, no distress.  Skin: normal turgor, no rashes, warm and dry HEENT: Normocephalic,  atraumatic. Pupils equal round and reactive to light; sclera anicteric; extraocular muscles intact;  Nose without nasal septal hypertrophy Mouth/Parynx benign; Mallinpatti scale 2/3 Neck: No JVD, no carotid bruits; normal carotid upstroke Lungs: clear to ausculatation and percussion; no wheezing or rales Chest wall: without tenderness to palpitation Heart: PMI not displaced, RRR, s1 s2  normal, 1/6 systolic murmur, no diastolic murmur, no rubs, gallops, thrills, or heaves Abdomen: soft, nontender; no hepatosplenomehaly, BS+; abdominal aorta nontender and not dilated by palpation. Back: no CVA tenderness Pulses 2+ Musculoskeletal: full range of motion, normal strength, no joint deformities Extremities: no clubbing cyanosis or edema, Homan's sign negative  Neurologic: grossly nonfocal; Cranial nerves grossly wnl Psychologic: Normal mood and affect   Studies/Labs Reviewed:   EKG Interpretation Date/Time:  Monday August 17 2023 12:25:44 EST Ventricular Rate:  75 PR Interval:  150 QRS Duration:  84 QT Interval:  388 QTC Calculation: 433 R Axis:   19  Text Interpretation: Normal sinus rhythm Normal ECG When compared with ECG of 28-Apr-2023 11:12, No significant change was found Confirmed by Nicki Guadalajara ([id number]) on 08/19/2023 4:27:09 PM    April 28, 2023 ECG (independently read by me): Normal sinus rhythm at 73 bpm.  No ectopy.  PR interval 136 ms QTc interval 445 ms.   Recent Labs:    Latest Ref Rng & Units 01/22/2023    4:32 AM 01/21/2023   12:11 AM 01/20/2023    4:43 AM  BMP  Glucose 70 - 99 mg/dL 99  604  540   BUN 6 - 20 mg/dL 6  7  6    Creatinine 0.61 - 1.24 mg/dL 9.81  1.91  4.78   Sodium 135 - 145 mmol/L 138  137  137   Potassium 3.5 - 5.1 mmol/L 4.2  3.7  4.3   Chloride 98 - 111 mmol/L 98  101  98   CO2 22 - 32 mmol/L 28  27  26    Calcium 8.9 - 10.3 mg/dL 9.4  8.9  9.1         Latest Ref Rng & Units 01/20/2023    4:43 AM 01/14/2023    2:50 PM 11/17/2021    7:12 AM   Hepatic Function  Total Protein 6.5 - 8.1 g/dL 6.1  6.7  7.2   Albumin 3.5 - 5.0 g/dL 3.6  4.0  4.1   AST 15 - 41 U/L 54  160  35   ALT 0 - 44 U/L 45  71  34   Alk Phosphatase 38 - 126 U/L 51  65  55   Total Bilirubin 0.3 - 1.2 mg/dL 0.7  0.7  0.8        Latest Ref Rng & Units 01/22/2023    4:32 AM 01/21/2023   12:11 AM 01/20/2023    4:43 AM  CBC  WBC 4.0 - 10.5 K/uL 8.1  9.7  9.3   Hemoglobin 13.0 - 17.0 g/dL 29.5  62.1  30.8   Hematocrit 39.0 - 52.0 % 42.1  41.8  46.4   Platelets 150 - 400 K/uL 219  196  209    Lab Results  Component Value Date   MCV 95.9 01/22/2023   MCV 96.1 01/21/2023   MCV 96.9 01/20/2023   Lab Results  Component Value Date   TSH 2.594 01/14/2023   Lab Results  Component Value Date   HGBA1C 5.5 01/14/2023     BNP    Component Value Date/Time   BNP 71.0 01/22/2023 0432    ProBNP No results found for: "PROBNP"   Lipid Panel     Component Value Date/Time   CHOL 288 (H) 01/14/2023 1450   TRIG 169 (H) 01/14/2023 1450   HDL 84 01/14/2023 1450   CHOLHDL 3.4 01/14/2023 1450   VLDL 34 01/14/2023 1450   LDLCALC 170 (H)  01/14/2023 1450     RADIOLOGY: LONG TERM MONITOR (3-14 DAYS) Result Date: 08/09/2023 Patch Wear Time:  13 days and 23 hours (2024-12-23T00:30:05-499 to 2025-01-06T00:30:01-499) Patient had a min HR of 48 bpm, max HR of 152 bpm, and avg HR of 82 bpm. Predominant underlying rhythm was Sinus Rhythm. 1 run of Supraventricular Tachycardia occurred lasting 4 beats with a max rate of 152 bpm (avg 145 bpm). Isolated SVEs were rare (<1.0%), SVE Couplets were rare (<1.0%), and no SVE Triplets were present. Isolated VEs were rare (<1.0%), and no VE Couplets or VE Triplets were present. Ventricular Trigeminy was present. The predominant rhythm was normal sinus rhythm at 82 bpm.  The slowest sinus rhythm was sinus bradycardia at 48 bpm which occurred at 7:43 AM on December 27.  The fastest sinus tachycardia was 148 bpm which occurred at 1:28  PM on December 24.  There was 1 brief episode of SVT lasting 4 beats at an average rate at 145.  There were rare atrial and ventricular beats.  There was 1 episode of ventricular trigeminal rhythm.  There were no episodes of atrial fibrillation or significant sinus pauses.     Additional studies/ records that were reviewed today include:  I reviewed his prior ER records and laboratory   ASSESSMENT:    1. Shortness of breath: resolved   2. Palpitations   3. Family history of coronary artery disease   4. Lower extremity edema: resolved   5. History of Elevated LFTs   6. Polysubstance abuse [facility])     PLAN:  Eric Nolan is a 43 year old male who has a significant history of prior opiate addiction, as well as excessive alcohol use with often drinking over a gallon of liquor every 3 days.  He ultimately completely quit alcohol and underwent detoxification with behavioral health and subsequent rehabilitation since August 2024.  He has family history of his mother suffering a TIA and his father having undergone valve surgery and also had a TIA.  There are family members who also have had heart attacks and strokes.  He had 1 brother who died of an overdose at age 55.  When I initially evaluated him, he was experiencing palpitations, lightheadedness, dizziness, some shortness of breath, and intermittently has experience ankle and feet swelling.  I scheduled him for 2D echo Doppler study which was essentially normal with EF at 55 to 60%, normal strain pattern and normal valves.  He wore a 2-week ZIO monitor which showed predominant sinus rhythm averaging 82 bpm.  Slowest heart rate was 48 with maximum 152.  There was 1 brief run of SVT lasting 4 beats at an average rate at 145 bpm.  He had rare isolated atrial and ventricular ectopy.  There were no episodes of atrial fibrillation or significant sinus pauses.  Transient ventricular bigeminy was noted.  Presently, he feels significantly improved and has  been sober for the past 6 months.  He does get testosterone injections and is on vitamin D.  Clinically he feels well.  I reviewed his calcium score which was outstanding at 0.  I  reinforced the importance of proper diet and exercise.  Will be following up with primary.  Subsequent LFTs should be performed for reassessment of remote increase.  Presently he is cardiovascular stable.  Continues to be on bupropion and Lexapro for depression.  He is no longer taking hydrochlorothiazide.  He only takes propranolol on a as needed basis.  We discussed potential yearly cardiology evaluation but at  present he prefers on a as needed basis.   Medication Adjustments/Labs and Tests Ordered: Current medicines are reviewed at length with the patient today.  Concerns regarding medicines are outlined above.  Medication changes, Labs and Tests ordered today are listed in the Patient Instructions below. Patient Instructions  Medication Instructions:  No medication changes were made during today's visit  *If you need a refill on your cardiac medications before your next appointment, please call your pharmacy*   Lab Work: No labs were ordered during today's visit.  If you have labs (blood work) drawn today and your tests are completely normal, you will receive your results only by: MyChart Message (if you have MyChart) OR A paper copy in the mail If you have any lab test that is abnormal or we need to change your treatment, we will call you to review the results.   Testing/Procedures: No procedures ordered today.    Follow-Up: At [facility], you and your health needs are our priority.  As part of our continuing mission to provide you with exceptional heart care, we have created designated Provider Care Teams.  These Care Teams include your primary Cardiologist (physician) and Advanced Practice Providers (APPs -  Physician Assistants and Nurse Practitioners) who all work together to provide you with  the care you need, when you need it.  We recommend signing up for the patient portal called "MyChart".  Sign up information is provided on this After Visit Summary.  MyChart is used to connect with patients for Virtual Visits (Telemedicine).  Patients are able to view lab/test results, encounter notes, upcoming appointments, etc.  Non-urgent messages can be sent to your provider as well.   To learn more about what you can do with MyChart, go to [email or web address].    Your next appointment:  as needed for heart care        1st Floor: - Lobby - Registration  - Pharmacy  - Lab - Cafe   2nd Floor: - PV Lab - Diagnostic Testing (echo, CT, nuclear med)   3rd Floor: - Vacant   4th Floor: - TCTS (cardiothoracic surgery) - AFib Clinic - Structural Heart Clinic - Vascular Surgery  - Vascular Ultrasound   5th Floor: - HeartCare Cardiology (general and EP) - Clinical Pharmacy for coumadin, hypertension, lipid, weight-loss medications, and med management appointments      Valet parking services will be available as well.            Signed, Nicki Guadalajara, MD  08/19/2023 4:43 PM    [facility] [address], [address], [place], [place]  [address] Phone: [phone]

## 2023-08-17 NOTE — Patient Instructions (Addendum)
 Medication Instructions:  No medication changes were made during today's visit  *If you need a refill on your cardiac medications before your next appointment, please call your pharmacy*   Lab Work: No labs were ordered during today's visit.  If you have labs (blood work) drawn today and your tests are completely normal, you will receive your results only by: MyChart Message (if you have MyChart) OR A paper copy in the mail If you have any lab test that is abnormal or we need to change your treatment, we will call you to review the results.   Testing/Procedures: No procedures ordered today.    Follow-Up: At [facility], you and your health needs are our priority.  As part of our continuing mission to provide you with exceptional heart care, we have created designated Provider Care Teams.  These Care Teams include your primary Cardiologist (physician) and Advanced Practice Providers (APPs -  Physician Assistants and Nurse Practitioners) who all work together to provide you with the care you need, when you need it.  We recommend signing up for the patient portal called "MyChart".  Sign up information is provided on this After Visit Summary.  MyChart is used to connect with patients for Virtual Visits (Telemedicine).  Patients are able to view lab/test results, encounter notes, upcoming appointments, etc.  Non-urgent messages can be sent to your provider as well.   To learn more about what you can do with MyChart, go to [email or web address].    Your next appointment:  as needed for heart care        1st Floor: - Lobby - Registration  - Pharmacy  - Lab - Cafe   2nd Floor: - PV Lab - Diagnostic Testing (echo, CT, nuclear med)   3rd Floor: - Vacant   4th Floor: - TCTS (cardiothoracic surgery) - AFib Clinic - Structural Heart Clinic - Vascular Surgery  - Vascular Ultrasound   5th Floor: - HeartCare Cardiology (general and EP) - Clinical Pharmacy for coumadin,  hypertension, lipid, weight-loss medications, and med management appointments      Valet parking services will be available as well.

## 2023-08-19 ENCOUNTER — Encounter: Payer: Self-pay | Admitting: Cardiovascular Disease

## 2024-03-07 ENCOUNTER — Other Ambulatory Visit (HOSPITAL_COMMUNITY): Payer: Self-pay

## 2024-07-19 ENCOUNTER — Telehealth: Payer: Self-pay

## 2024-07-19 NOTE — Telephone Encounter (Signed)
 Copied from CRM #[id number]. Topic: General - Other >> Jul 19, 2024  8:46 AM Deaijah H wrote: Reason for CRM: Norleen w/ [employer] called in requesting to speak with a provider regarding eligibility for a patient. Please call [phone]  Was Routed to the wrong office. Will lace an error ticket. KH
